# Patient Record
Sex: Female | Born: 1935
Health system: Southern US, Community
[De-identification: ages and names within clinical notes are randomized; demographics above are authoritative.]

## PROBLEM LIST (undated history)

## (undated) DIAGNOSIS — E785 Hyperlipidemia, unspecified: Secondary | ICD-10-CM

## (undated) DIAGNOSIS — K2100 Gastro-esophageal reflux disease with esophagitis, without bleeding: Secondary | ICD-10-CM

## (undated) DIAGNOSIS — E119 Type 2 diabetes mellitus without complications: Secondary | ICD-10-CM

## (undated) DIAGNOSIS — N6019 Diffuse cystic mastopathy of unspecified breast: Secondary | ICD-10-CM

## (undated) DIAGNOSIS — Z9181 History of falling: Secondary | ICD-10-CM

## (undated) DIAGNOSIS — S72009D Fracture of unspecified part of neck of unspecified femur, subsequent encounter for closed fracture with routine healing: Secondary | ICD-10-CM

## (undated) DIAGNOSIS — K559 Vascular disorder of intestine, unspecified: Secondary | ICD-10-CM

## (undated) DIAGNOSIS — Z5189 Encounter for other specified aftercare: Secondary | ICD-10-CM

## (undated) DIAGNOSIS — D682 Hereditary deficiency of other clotting factors: Secondary | ICD-10-CM

## (undated) DIAGNOSIS — I071 Rheumatic tricuspid insufficiency: Secondary | ICD-10-CM

## (undated) DIAGNOSIS — D126 Benign neoplasm of colon, unspecified: Secondary | ICD-10-CM

## (undated) DIAGNOSIS — E559 Vitamin D deficiency, unspecified: Secondary | ICD-10-CM

## (undated) DIAGNOSIS — R7309 Other abnormal glucose: Secondary | ICD-10-CM

## (undated) DIAGNOSIS — I272 Pulmonary hypertension, unspecified: Secondary | ICD-10-CM

## (undated) DIAGNOSIS — G4700 Insomnia, unspecified: Secondary | ICD-10-CM

## (undated) DIAGNOSIS — I701 Atherosclerosis of renal artery: Secondary | ICD-10-CM

## (undated) DIAGNOSIS — K5792 Diverticulitis of intestine, part unspecified, without perforation or abscess without bleeding: Secondary | ICD-10-CM

## (undated) DIAGNOSIS — F3289 Other specified depressive episodes: Secondary | ICD-10-CM

## (undated) DIAGNOSIS — R011 Cardiac murmur, unspecified: Secondary | ICD-10-CM

## (undated) DIAGNOSIS — K5900 Constipation, unspecified: Secondary | ICD-10-CM

## (undated) DIAGNOSIS — R0989 Other specified symptoms and signs involving the circulatory and respiratory systems: Secondary | ICD-10-CM

## (undated) DIAGNOSIS — F329 Major depressive disorder, single episode, unspecified: Secondary | ICD-10-CM

## (undated) DIAGNOSIS — J310 Chronic rhinitis: Secondary | ICD-10-CM

## (undated) DIAGNOSIS — I10 Essential (primary) hypertension: Secondary | ICD-10-CM

## (undated) DIAGNOSIS — H3530 Unspecified macular degeneration: Secondary | ICD-10-CM

## (undated) DIAGNOSIS — I773 Arterial fibromuscular dysplasia: Secondary | ICD-10-CM

## (undated) DIAGNOSIS — K269 Duodenal ulcer, unspecified as acute or chronic, without hemorrhage or perforation: Secondary | ICD-10-CM

## (undated) DIAGNOSIS — R52 Pain, unspecified: Secondary | ICD-10-CM

## (undated) DIAGNOSIS — I771 Stricture of artery: Secondary | ICD-10-CM

## (undated) DIAGNOSIS — K219 Gastro-esophageal reflux disease without esophagitis: Secondary | ICD-10-CM

## (undated) HISTORY — PX: AORTA - SUPERIOR MESENTERIC AND AORTA - RENAL ARTERY BYPASS GRAFT: SUR175

## (undated) HISTORY — PX: RENAL ARTERY BYPASS: SHX2318

## (undated) HISTORY — PX: TONSILLECTOMY: SUR1361

## (undated) HISTORY — DX: Type 2 diabetes mellitus without complications: E11.9

## (undated) HISTORY — PX: APPENDECTOMY: SHX54

## (undated) HISTORY — PX: HIP SURGERY: SHX245

## (undated) HISTORY — PX: CHOLECYSTECTOMY: SHX55

## (undated) HISTORY — PX: DILATION AND CURETTAGE OF UTERUS: SHX78

---

## 2014-01-30 ENCOUNTER — Emergency Department (HOSPITAL_COMMUNITY)
Admission: EM | Admit: 2014-01-30 | Discharge: 2014-01-30 | Disposition: A | Discharge status: Rehabilitation Facility | Payer: Medicare Other | Attending: Emergency Medicine | Admitting: Emergency Medicine

## 2014-01-30 ENCOUNTER — Emergency Department (HOSPITAL_COMMUNITY): Payer: Medicare Other

## 2014-01-30 ENCOUNTER — Encounter (HOSPITAL_COMMUNITY): Payer: Self-pay | Admitting: Emergency Medicine

## 2014-01-30 DIAGNOSIS — W19XXXA Unspecified fall, initial encounter: Secondary | ICD-10-CM | POA: Insufficient documentation

## 2014-01-30 DIAGNOSIS — Y939 Activity, unspecified: Secondary | ICD-10-CM | POA: Insufficient documentation

## 2014-01-30 DIAGNOSIS — G4700 Insomnia, unspecified: Secondary | ICD-10-CM | POA: Insufficient documentation

## 2014-01-30 DIAGNOSIS — D682 Hereditary deficiency of other clotting factors: Secondary | ICD-10-CM | POA: Insufficient documentation

## 2014-01-30 DIAGNOSIS — K219 Gastro-esophageal reflux disease without esophagitis: Secondary | ICD-10-CM | POA: Insufficient documentation

## 2014-01-30 DIAGNOSIS — R7309 Other abnormal glucose: Secondary | ICD-10-CM | POA: Insufficient documentation

## 2014-01-30 DIAGNOSIS — R011 Cardiac murmur, unspecified: Secondary | ICD-10-CM | POA: Insufficient documentation

## 2014-01-30 DIAGNOSIS — K5900 Constipation, unspecified: Secondary | ICD-10-CM | POA: Insufficient documentation

## 2014-01-30 DIAGNOSIS — S72009D Fracture of unspecified part of neck of unspecified femur, subsequent encounter for closed fracture with routine healing: Secondary | ICD-10-CM | POA: Insufficient documentation

## 2014-01-30 DIAGNOSIS — K5732 Diverticulitis of large intestine without perforation or abscess without bleeding: Secondary | ICD-10-CM | POA: Insufficient documentation

## 2014-01-30 DIAGNOSIS — I701 Atherosclerosis of renal artery: Secondary | ICD-10-CM | POA: Insufficient documentation

## 2014-01-30 DIAGNOSIS — R0989 Other specified symptoms and signs involving the circulatory and respiratory systems: Secondary | ICD-10-CM | POA: Insufficient documentation

## 2014-01-30 DIAGNOSIS — D126 Benign neoplasm of colon, unspecified: Secondary | ICD-10-CM | POA: Insufficient documentation

## 2014-01-30 DIAGNOSIS — K269 Duodenal ulcer, unspecified as acute or chronic, without hemorrhage or perforation: Secondary | ICD-10-CM | POA: Insufficient documentation

## 2014-01-30 DIAGNOSIS — Z5189 Encounter for other specified aftercare: Secondary | ICD-10-CM | POA: Insufficient documentation

## 2014-01-30 DIAGNOSIS — I10 Essential (primary) hypertension: Secondary | ICD-10-CM | POA: Insufficient documentation

## 2014-01-30 DIAGNOSIS — K559 Vascular disorder of intestine, unspecified: Secondary | ICD-10-CM | POA: Insufficient documentation

## 2014-01-30 DIAGNOSIS — K2100 Gastro-esophageal reflux disease with esophagitis, without bleeding: Secondary | ICD-10-CM | POA: Insufficient documentation

## 2014-01-30 DIAGNOSIS — Z9181 History of falling: Secondary | ICD-10-CM | POA: Insufficient documentation

## 2014-01-30 DIAGNOSIS — F329 Major depressive disorder, single episode, unspecified: Secondary | ICD-10-CM | POA: Insufficient documentation

## 2014-01-30 DIAGNOSIS — N6019 Diffuse cystic mastopathy of unspecified breast: Secondary | ICD-10-CM | POA: Insufficient documentation

## 2014-01-30 DIAGNOSIS — S7001XA Contusion of right hip, initial encounter: Secondary | ICD-10-CM

## 2014-01-30 DIAGNOSIS — F3289 Other specified depressive episodes: Secondary | ICD-10-CM | POA: Insufficient documentation

## 2014-01-30 DIAGNOSIS — E559 Vitamin D deficiency, unspecified: Secondary | ICD-10-CM | POA: Insufficient documentation

## 2014-01-30 DIAGNOSIS — Z87891 Personal history of nicotine dependence: Secondary | ICD-10-CM | POA: Insufficient documentation

## 2014-01-30 DIAGNOSIS — H3530 Unspecified macular degeneration: Secondary | ICD-10-CM | POA: Insufficient documentation

## 2014-01-30 DIAGNOSIS — I771 Stricture of artery: Secondary | ICD-10-CM | POA: Insufficient documentation

## 2014-01-30 DIAGNOSIS — R52 Pain, unspecified: Secondary | ICD-10-CM | POA: Insufficient documentation

## 2014-01-30 DIAGNOSIS — I7789 Other specified disorders of arteries and arterioles: Secondary | ICD-10-CM | POA: Insufficient documentation

## 2014-01-30 DIAGNOSIS — J310 Chronic rhinitis: Secondary | ICD-10-CM | POA: Insufficient documentation

## 2014-01-30 DIAGNOSIS — Z888 Allergy status to other drugs, medicaments and biological substances status: Secondary | ICD-10-CM | POA: Insufficient documentation

## 2014-01-30 DIAGNOSIS — Z880 Allergy status to penicillin: Secondary | ICD-10-CM | POA: Insufficient documentation

## 2014-01-30 DIAGNOSIS — S7000XA Contusion of unspecified hip, initial encounter: Secondary | ICD-10-CM | POA: Insufficient documentation

## 2014-01-30 DIAGNOSIS — Y929 Unspecified place or not applicable: Secondary | ICD-10-CM | POA: Insufficient documentation

## 2014-01-30 DIAGNOSIS — I079 Rheumatic tricuspid valve disease, unspecified: Secondary | ICD-10-CM | POA: Insufficient documentation

## 2014-01-30 HISTORY — DX: Constipation, unspecified: K59.00

## 2014-01-30 HISTORY — DX: Gastro-esophageal reflux disease with esophagitis, without bleeding: K21.00

## 2014-01-30 HISTORY — DX: Diffuse cystic mastopathy of unspecified breast: N60.19

## 2014-01-30 HISTORY — DX: Essential (primary) hypertension: I10

## 2014-01-30 HISTORY — DX: Cardiac murmur, unspecified: R01.1

## 2014-01-30 HISTORY — DX: Encounter for other specified aftercare: Z51.89

## 2014-01-30 HISTORY — DX: History of falling: Z91.81

## 2014-01-30 HISTORY — DX: Gastro-esophageal reflux disease with esophagitis: K21.0

## 2014-01-30 HISTORY — DX: Other abnormal glucose: R73.09

## 2014-01-30 HISTORY — DX: Benign neoplasm of colon, unspecified: D12.6

## 2014-01-30 HISTORY — DX: Pain, unspecified: R52

## 2014-01-30 HISTORY — DX: Duodenal ulcer, unspecified as acute or chronic, without hemorrhage or perforation: K26.9

## 2014-01-30 HISTORY — DX: Pulmonary hypertension, unspecified: I27.20

## 2014-01-30 HISTORY — DX: Major depressive disorder, single episode, unspecified: F32.9

## 2014-01-30 HISTORY — DX: Other specified depressive episodes: F32.89

## 2014-01-30 HISTORY — DX: Stricture of artery: I77.1

## 2014-01-30 HISTORY — DX: Hereditary deficiency of other clotting factors: D68.2

## 2014-01-30 HISTORY — DX: Atherosclerosis of renal artery: I70.1

## 2014-01-30 HISTORY — DX: Vascular disorder of intestine, unspecified: K55.9

## 2014-01-30 HISTORY — DX: Diverticulitis of intestine, part unspecified, without perforation or abscess without bleeding: K57.92

## 2014-01-30 HISTORY — DX: Rheumatic tricuspid insufficiency: I07.1

## 2014-01-30 HISTORY — DX: Unspecified macular degeneration: H35.30

## 2014-01-30 HISTORY — DX: Arterial fibromuscular dysplasia: I77.3

## 2014-01-30 HISTORY — DX: Gastro-esophageal reflux disease without esophagitis: K21.9

## 2014-01-30 HISTORY — DX: Hyperlipidemia, unspecified: E78.5

## 2014-01-30 HISTORY — DX: Fracture of unspecified part of neck of unspecified femur, subsequent encounter for closed fracture with routine healing: S72.009D

## 2014-01-30 HISTORY — DX: Vitamin D deficiency, unspecified: E55.9

## 2014-01-30 HISTORY — DX: Chronic rhinitis: J31.0

## 2014-01-30 HISTORY — DX: Insomnia, unspecified: G47.00

## 2014-01-30 HISTORY — DX: Other specified symptoms and signs involving the circulatory and respiratory systems: R09.89

## 2014-01-30 NOTE — ED Notes (Signed)
Report attempted to [facility] multiple times.

## 2014-01-30 NOTE — ED Provider Notes (Signed)
CSN: [id number]     Arrival date & time 01/30/14  0356 History   First MD Initiated Contact with Patient 01/30/14 0421     Chief Complaint  Patient presents with  . Fall     (Consider location/radiation/quality/duration/timing/severity/associated sxs/prior Treatment) Patient is a 79 y.o. female presenting with fall. The history is provided by the patient.  Fall This is a new problem. Pertinent negatives include no chest pain, no abdominal pain, no headaches and no shortness of breath.   patient with fall. Complaining of right hip pain. Recently had hip replacement. No other complaints at this time. Patient is not exactly sure what happened. States she hurts her head. She she had done rehabilitation earlier today also. Does not think she hit her head  Past Medical History  Diagnosis Date  . Other specified rehabilitation procedure(V57.89)   . Aftercare for healing traumatic fracture of hip   . Personal history of fall   . Unspecified essential hypertension   . Other abnormal glucose   . Generalized pain   . Unspecified constipation   . Chronic rhinitis   . Depressive disorder, not elsewhere classified   . Reflux esophagitis   . Insomnia, unspecified   . Hyperlipidemia   . Fibromuscular dysplasia   . Diverticulitis   . GERD (gastroesophageal reflux disease)   . Factor V deficiency   . Heart murmur   . Carotid bruit   . Fibrocystic breast disease   . Hyperplasia, renal artery fibromuscular   . Macular degeneration   . Subclavian arterial stenosis   . Tubular adenoma of colon   . Mesenteric ischemia   . Renal artery stenosis   . Tricuspid regurgitation   . Pulmonary HTN   . Vitamin D deficiency   . Duodenal ulcer    Past Surgical History  Procedure Laterality Date  . Hip surgery    . Tonsillectomy    . Dilation and curettage of uterus    . Cholecystectomy    . Appendectomy    . Renal artery bypass    . Aorta - superior mesenteric and aorta - renal artery bypass  graft     History reviewed. No pertinent family history. History  Substance Use Topics  . Smoking status: Former Research scientist (life sciences)  . Smokeless tobacco: Former Systems developer  . Alcohol Use: No   OB History   Grav Para Term Preterm Abortions TAB SAB Ect Mult Living                 Review of Systems  Constitutional: Negative for activity change and appetite change.  Eyes: Negative for pain.  Respiratory: Negative for chest tightness and shortness of breath.   Cardiovascular: Negative for chest pain and leg swelling.  Gastrointestinal: Negative for nausea, vomiting, abdominal pain and diarrhea.  Genitourinary: Negative for flank pain.  Musculoskeletal: Negative for back pain, neck pain and neck stiffness.  Skin: Positive for wound. Negative for rash.  Neurological: Negative for weakness, numbness and headaches.  Hematological: Bruises/bleeds easily.  Psychiatric/Behavioral: Negative for behavioral problems.      Allergies  Effexor; Mirapex; Nexium; Prevacid; Ticlid; Wellbutrin; Amoxil; Metformin and related; and Penicillins  Home Medications   Prior to Admission medications   Not on File   BP 168/60  Pulse 70  Temp(Src) 97.9 F (36.6 C) (Oral)  Resp 18  Ht 5\' 4"  (1.626 m)  Wt 154 lb (69.854 kg)  BMI 26.42 kg/m2  SpO2 98% Physical Exam  Constitutional: She appears well-developed and well-nourished.  Cardiovascular:  Normal rate and regular rhythm.   Pulmonary/Chest: Effort normal and breath sounds normal.  Abdominal: Soft. There is no tenderness.  Musculoskeletal:  Dressing over right hip with surgical wound with staples. Mild tenderness. No fluctuance. Some pain with movement of right hip. No deformity. Neurovascular intact over right foot. No tenderness her abdomen.  Neurological: She is alert.  Patient is awake and appropriate. It reported baseline.  Skin: Skin is warm.    ED Course  Procedures (including critical care time) Labs Review Labs Reviewed - No data to  display  Imaging Review Dg Hip Complete Right  01/30/2014   CLINICAL DATA:  Fall with right hip pain.  EXAM: RIGHT HIP - COMPLETE 2+ VIEW  COMPARISON:  None.  FINDINGS: Right hip bipolar hemiarthroplasty. There is a cerclage wire along the proximal femur. There has been recent right hip surgery given there is cutaneous staples and periarticular gas. No dislocation or periprosthetic fracture.  No evidence of pelvic ring fracture or diastasis. Notable/advanced L4-5 degenerative disc disease with marked disc narrowing and endplate spurring.  IMPRESSION: Recent right hip hemiarthroplasty.  No adverse findings.   Electronically Signed   By: Jorje Guild M.D.   On: 01/30/2014 05:36     EKG Interpretation None      MDM   Final diagnoses:  Fall, initial encounter  Contusion, hip, right, initial encounter    Patient with fall. Right hip pain, no recent replacement. X-ray negative. No other apparent injury. Patient is at her baseline will be discharged back    [facility]. Alvino Chapel, MD 01/30/14 3244

## 2014-01-30 NOTE — Discharge Instructions (Signed)
Contusion °A contusion is a deep bruise. Contusions are the result of an injury that caused bleeding under the skin. The contusion may turn blue, purple, or yellow. Minor injuries will give you a painless contusion, but more severe contusions may stay painful and swollen for a few weeks.  °CAUSES  °A contusion is usually caused by a blow, trauma, or direct force to an area of the body. °SYMPTOMS  °· Swelling and redness of the injured area. °· Bruising of the injured area. °· Tenderness and soreness of the injured area. °· Pain. °DIAGNOSIS  °The diagnosis can be made by taking a history and physical exam. An X-ray, CT scan, or MRI may be needed to determine if there were any associated injuries, such as fractures. °TREATMENT  °Specific treatment will depend on what area of the body was injured. In general, the best treatment for a contusion is resting, icing, elevating, and applying cold compresses to the injured area. Over-the-counter medicines may also be recommended for pain control. Ask your caregiver what the best treatment is for your contusion. °HOME CARE INSTRUCTIONS  °· Put ice on the injured area. °¨ Put ice in a plastic bag. °¨ Place a towel between your skin and the bag. °¨ Leave the ice on for 15-20 minutes, 3-4 times a day, or as directed by your health care provider. °· Only take over-the-counter or prescription medicines for pain, discomfort, or fever as directed by your caregiver. Your caregiver may recommend avoiding anti-inflammatory medicines (aspirin, ibuprofen, and naproxen) for 48 hours because these medicines may increase bruising. °· Rest the injured area. °· If possible, elevate the injured area to reduce swelling. °SEEK IMMEDIATE MEDICAL CARE IF:  °· You have increased bruising or swelling. °· You have pain that is getting worse. °· Your swelling or pain is not relieved with medicines. °MAKE SURE YOU:  °· Understand these instructions. °· Will watch your condition. °· Will get help right  away if you are not doing well or get worse. °Document Released: 05/02/2005 Document Revised: 07/28/2013 Document Reviewed: 05/28/2011 °ExitCare® Patient Information ©2015 [employer]. This information is not intended to replace advice given to you by your health care provider. Make sure you discuss any questions you have with your health care provider. ° °

## 2014-12-21 ENCOUNTER — Other Ambulatory Visit: Payer: Self-pay | Admitting: Nurse Practitioner

## 2014-12-21 ENCOUNTER — Other Ambulatory Visit: Payer: Self-pay

## 2014-12-21 DIAGNOSIS — T148XXA Other injury of unspecified body region, initial encounter: Secondary | ICD-10-CM

## 2015-11-25 DIAGNOSIS — Z79899 Other long term (current) drug therapy: Secondary | ICD-10-CM | POA: Diagnosis not present

## 2015-11-25 DIAGNOSIS — I10 Essential (primary) hypertension: Secondary | ICD-10-CM | POA: Diagnosis not present

## 2015-11-25 DIAGNOSIS — G894 Chronic pain syndrome: Secondary | ICD-10-CM | POA: Diagnosis not present

## 2015-11-25 DIAGNOSIS — I739 Peripheral vascular disease, unspecified: Secondary | ICD-10-CM | POA: Diagnosis not present

## 2015-11-25 DIAGNOSIS — R413 Other amnesia: Secondary | ICD-10-CM | POA: Diagnosis not present

## 2015-11-25 DIAGNOSIS — E559 Vitamin D deficiency, unspecified: Secondary | ICD-10-CM | POA: Diagnosis not present

## 2015-11-25 DIAGNOSIS — Z1389 Encounter for screening for other disorder: Secondary | ICD-10-CM | POA: Diagnosis not present

## 2015-11-25 DIAGNOSIS — E119 Type 2 diabetes mellitus without complications: Secondary | ICD-10-CM | POA: Diagnosis not present

## 2015-11-25 DIAGNOSIS — Z0001 Encounter for general adult medical examination with abnormal findings: Secondary | ICD-10-CM | POA: Diagnosis not present

## 2015-11-25 DIAGNOSIS — Z0000 Encounter for general adult medical examination without abnormal findings: Secondary | ICD-10-CM | POA: Diagnosis not present

## 2015-11-25 DIAGNOSIS — F325 Major depressive disorder, single episode, in full remission: Secondary | ICD-10-CM | POA: Diagnosis not present

## 2016-04-11 ENCOUNTER — Other Ambulatory Visit: Payer: Self-pay | Admitting: Internal Medicine

## 2016-04-11 DIAGNOSIS — Z1231 Encounter for screening mammogram for malignant neoplasm of breast: Secondary | ICD-10-CM

## 2016-04-17 ENCOUNTER — Ambulatory Visit
Admission: RE | Admit: 2016-04-17 | Discharge: 2016-04-17 | Disposition: A | Discharge status: Home / Self Care | Payer: Medicare Other | Source: Ambulatory Visit | Attending: Internal Medicine | Admitting: Internal Medicine

## 2016-04-17 DIAGNOSIS — Z1231 Encounter for screening mammogram for malignant neoplasm of breast: Secondary | ICD-10-CM | POA: Diagnosis not present

## 2016-05-29 DIAGNOSIS — F332 Major depressive disorder, recurrent severe without psychotic features: Secondary | ICD-10-CM | POA: Diagnosis not present

## 2016-06-01 DIAGNOSIS — G4700 Insomnia, unspecified: Secondary | ICD-10-CM | POA: Diagnosis not present

## 2016-06-01 DIAGNOSIS — E559 Vitamin D deficiency, unspecified: Secondary | ICD-10-CM | POA: Diagnosis not present

## 2016-06-01 DIAGNOSIS — F325 Major depressive disorder, single episode, in full remission: Secondary | ICD-10-CM | POA: Diagnosis not present

## 2016-06-01 DIAGNOSIS — Z23 Encounter for immunization: Secondary | ICD-10-CM | POA: Diagnosis not present

## 2016-06-01 DIAGNOSIS — I739 Peripheral vascular disease, unspecified: Secondary | ICD-10-CM | POA: Diagnosis not present

## 2016-06-01 DIAGNOSIS — G894 Chronic pain syndrome: Secondary | ICD-10-CM | POA: Diagnosis not present

## 2016-06-01 DIAGNOSIS — F419 Anxiety disorder, unspecified: Secondary | ICD-10-CM | POA: Diagnosis not present

## 2016-06-01 DIAGNOSIS — R413 Other amnesia: Secondary | ICD-10-CM | POA: Diagnosis not present

## 2016-06-01 DIAGNOSIS — I10 Essential (primary) hypertension: Secondary | ICD-10-CM | POA: Diagnosis not present

## 2016-06-01 DIAGNOSIS — E119 Type 2 diabetes mellitus without complications: Secondary | ICD-10-CM | POA: Diagnosis not present

## 2016-08-29 DIAGNOSIS — R2689 Other abnormalities of gait and mobility: Secondary | ICD-10-CM | POA: Diagnosis not present

## 2016-08-29 DIAGNOSIS — R269 Unspecified abnormalities of gait and mobility: Secondary | ICD-10-CM | POA: Diagnosis not present

## 2016-08-29 DIAGNOSIS — K529 Noninfective gastroenteritis and colitis, unspecified: Secondary | ICD-10-CM | POA: Diagnosis not present

## 2016-09-12 DIAGNOSIS — R2681 Unsteadiness on feet: Secondary | ICD-10-CM | POA: Diagnosis not present

## 2016-09-12 DIAGNOSIS — R262 Difficulty in walking, not elsewhere classified: Secondary | ICD-10-CM | POA: Diagnosis not present

## 2016-09-14 DIAGNOSIS — R2681 Unsteadiness on feet: Secondary | ICD-10-CM | POA: Diagnosis not present

## 2016-09-14 DIAGNOSIS — R262 Difficulty in walking, not elsewhere classified: Secondary | ICD-10-CM | POA: Diagnosis not present

## 2016-09-17 DIAGNOSIS — R2681 Unsteadiness on feet: Secondary | ICD-10-CM | POA: Diagnosis not present

## 2016-09-17 DIAGNOSIS — R262 Difficulty in walking, not elsewhere classified: Secondary | ICD-10-CM | POA: Diagnosis not present

## 2016-09-19 DIAGNOSIS — R2681 Unsteadiness on feet: Secondary | ICD-10-CM | POA: Diagnosis not present

## 2016-09-19 DIAGNOSIS — R262 Difficulty in walking, not elsewhere classified: Secondary | ICD-10-CM | POA: Diagnosis not present

## 2016-09-20 DIAGNOSIS — R262 Difficulty in walking, not elsewhere classified: Secondary | ICD-10-CM | POA: Diagnosis not present

## 2016-09-20 DIAGNOSIS — R2681 Unsteadiness on feet: Secondary | ICD-10-CM | POA: Diagnosis not present

## 2016-09-25 DIAGNOSIS — R2681 Unsteadiness on feet: Secondary | ICD-10-CM | POA: Diagnosis not present

## 2016-09-25 DIAGNOSIS — R262 Difficulty in walking, not elsewhere classified: Secondary | ICD-10-CM | POA: Diagnosis not present

## 2016-09-26 DIAGNOSIS — F332 Major depressive disorder, recurrent severe without psychotic features: Secondary | ICD-10-CM | POA: Diagnosis not present

## 2016-09-27 DIAGNOSIS — R2681 Unsteadiness on feet: Secondary | ICD-10-CM | POA: Diagnosis not present

## 2016-09-27 DIAGNOSIS — R262 Difficulty in walking, not elsewhere classified: Secondary | ICD-10-CM | POA: Diagnosis not present

## 2016-09-28 DIAGNOSIS — R2681 Unsteadiness on feet: Secondary | ICD-10-CM | POA: Diagnosis not present

## 2016-09-28 DIAGNOSIS — R262 Difficulty in walking, not elsewhere classified: Secondary | ICD-10-CM | POA: Diagnosis not present

## 2016-10-02 DIAGNOSIS — R262 Difficulty in walking, not elsewhere classified: Secondary | ICD-10-CM | POA: Diagnosis not present

## 2016-10-02 DIAGNOSIS — R2681 Unsteadiness on feet: Secondary | ICD-10-CM | POA: Diagnosis not present

## 2016-10-05 DIAGNOSIS — R2681 Unsteadiness on feet: Secondary | ICD-10-CM | POA: Diagnosis not present

## 2016-10-05 DIAGNOSIS — R262 Difficulty in walking, not elsewhere classified: Secondary | ICD-10-CM | POA: Diagnosis not present

## 2016-10-08 DIAGNOSIS — R2681 Unsteadiness on feet: Secondary | ICD-10-CM | POA: Diagnosis not present

## 2016-10-08 DIAGNOSIS — R262 Difficulty in walking, not elsewhere classified: Secondary | ICD-10-CM | POA: Diagnosis not present

## 2016-10-11 DIAGNOSIS — R262 Difficulty in walking, not elsewhere classified: Secondary | ICD-10-CM | POA: Diagnosis not present

## 2016-10-11 DIAGNOSIS — R2681 Unsteadiness on feet: Secondary | ICD-10-CM | POA: Diagnosis not present

## 2016-10-15 DIAGNOSIS — R262 Difficulty in walking, not elsewhere classified: Secondary | ICD-10-CM | POA: Diagnosis not present

## 2016-10-15 DIAGNOSIS — R2681 Unsteadiness on feet: Secondary | ICD-10-CM | POA: Diagnosis not present

## 2016-10-16 DIAGNOSIS — R262 Difficulty in walking, not elsewhere classified: Secondary | ICD-10-CM | POA: Diagnosis not present

## 2016-10-16 DIAGNOSIS — R2681 Unsteadiness on feet: Secondary | ICD-10-CM | POA: Diagnosis not present

## 2017-03-21 DIAGNOSIS — E1140 Type 2 diabetes mellitus with diabetic neuropathy, unspecified: Secondary | ICD-10-CM | POA: Diagnosis not present

## 2017-03-21 DIAGNOSIS — Z1389 Encounter for screening for other disorder: Secondary | ICD-10-CM | POA: Diagnosis not present

## 2017-03-21 DIAGNOSIS — E785 Hyperlipidemia, unspecified: Secondary | ICD-10-CM | POA: Diagnosis not present

## 2017-03-21 DIAGNOSIS — E559 Vitamin D deficiency, unspecified: Secondary | ICD-10-CM | POA: Diagnosis not present

## 2017-03-21 DIAGNOSIS — I2729 Other secondary pulmonary hypertension: Secondary | ICD-10-CM | POA: Diagnosis not present

## 2017-03-21 DIAGNOSIS — Z0000 Encounter for general adult medical examination without abnormal findings: Secondary | ICD-10-CM | POA: Diagnosis not present

## 2017-03-25 DIAGNOSIS — F332 Major depressive disorder, recurrent severe without psychotic features: Secondary | ICD-10-CM | POA: Diagnosis not present

## 2017-09-09 DIAGNOSIS — K5790 Diverticulosis of intestine, part unspecified, without perforation or abscess without bleeding: Secondary | ICD-10-CM | POA: Diagnosis not present

## 2017-09-09 DIAGNOSIS — R10814 Left lower quadrant abdominal tenderness: Secondary | ICD-10-CM | POA: Diagnosis not present

## 2017-09-12 ENCOUNTER — Other Ambulatory Visit: Payer: Self-pay | Admitting: Internal Medicine

## 2017-09-12 DIAGNOSIS — K5792 Diverticulitis of intestine, part unspecified, without perforation or abscess without bleeding: Secondary | ICD-10-CM

## 2017-09-12 DIAGNOSIS — R109 Unspecified abdominal pain: Secondary | ICD-10-CM

## 2017-09-13 ENCOUNTER — Other Ambulatory Visit: Payer: Self-pay | Admitting: Internal Medicine

## 2017-09-13 ENCOUNTER — Ambulatory Visit
Admission: RE | Admit: 2017-09-13 | Discharge: 2017-09-13 | Disposition: A | Discharge status: Home / Self Care | Payer: Medicare Other | Source: Ambulatory Visit | Attending: Internal Medicine | Admitting: Internal Medicine

## 2017-09-13 DIAGNOSIS — R109 Unspecified abdominal pain: Secondary | ICD-10-CM

## 2017-09-13 DIAGNOSIS — K5730 Diverticulosis of large intestine without perforation or abscess without bleeding: Secondary | ICD-10-CM | POA: Diagnosis not present

## 2017-09-13 DIAGNOSIS — K5792 Diverticulitis of intestine, part unspecified, without perforation or abscess without bleeding: Secondary | ICD-10-CM

## 2017-09-13 MED ORDER — IOPAMIDOL (ISOVUE-300) INJECTION 61%
100.0000 mL | Freq: Once | INTRAVENOUS | Status: AC | PRN
  Administered 2017-09-13: 100 mL via INTRAVENOUS

## 2017-09-21 ENCOUNTER — Other Ambulatory Visit: Payer: Medicare Other

## 2017-10-18 DIAGNOSIS — F332 Major depressive disorder, recurrent severe without psychotic features: Secondary | ICD-10-CM | POA: Diagnosis not present

## 2017-10-29 DIAGNOSIS — R4182 Altered mental status, unspecified: Secondary | ICD-10-CM | POA: Diagnosis not present

## 2017-10-29 DIAGNOSIS — R2681 Unsteadiness on feet: Secondary | ICD-10-CM | POA: Diagnosis not present

## 2017-10-30 ENCOUNTER — Other Ambulatory Visit: Payer: Self-pay

## 2017-10-30 ENCOUNTER — Emergency Department (HOSPITAL_COMMUNITY): Payer: Medicare Other

## 2017-10-30 ENCOUNTER — Emergency Department (HOSPITAL_COMMUNITY)
Admission: EM | Admit: 2017-10-30 | Discharge: 2017-10-30 | Disposition: A | Discharge status: Home / Self Care | Payer: Medicare Other | Attending: Emergency Medicine | Admitting: Emergency Medicine

## 2017-10-30 ENCOUNTER — Encounter (HOSPITAL_COMMUNITY): Payer: Self-pay

## 2017-10-30 DIAGNOSIS — Z87891 Personal history of nicotine dependence: Secondary | ICD-10-CM | POA: Insufficient documentation

## 2017-10-30 DIAGNOSIS — R109 Unspecified abdominal pain: Secondary | ICD-10-CM | POA: Diagnosis not present

## 2017-10-30 DIAGNOSIS — Z7902 Long term (current) use of antithrombotics/antiplatelets: Secondary | ICD-10-CM | POA: Diagnosis not present

## 2017-10-30 DIAGNOSIS — I10 Essential (primary) hypertension: Secondary | ICD-10-CM | POA: Diagnosis not present

## 2017-10-30 DIAGNOSIS — K2970 Gastritis, unspecified, without bleeding: Secondary | ICD-10-CM | POA: Diagnosis not present

## 2017-10-30 DIAGNOSIS — Z79899 Other long term (current) drug therapy: Secondary | ICD-10-CM | POA: Insufficient documentation

## 2017-10-30 DIAGNOSIS — R1084 Generalized abdominal pain: Secondary | ICD-10-CM

## 2017-10-30 DIAGNOSIS — Z7984 Long term (current) use of oral hypoglycemic drugs: Secondary | ICD-10-CM | POA: Diagnosis not present

## 2017-10-30 DIAGNOSIS — R110 Nausea: Secondary | ICD-10-CM | POA: Diagnosis not present

## 2017-10-30 DIAGNOSIS — K7460 Unspecified cirrhosis of liver: Secondary | ICD-10-CM | POA: Diagnosis not present

## 2017-10-30 LAB — CBC
HEMATOCRIT: 30.1 % — AB (ref 36.0–46.0)
Hemoglobin: 9.7 g/dL — ABNORMAL LOW (ref 12.0–15.0)
MCH: 26.9 pg (ref 26.0–34.0)
MCHC: 32.2 g/dL (ref 30.0–36.0)
MCV: 83.6 fL (ref 78.0–100.0)
PLATELETS: 398 10*3/uL (ref 150–400)
RBC: 3.6 MIL/uL — ABNORMAL LOW (ref 3.87–5.11)
RDW: 15.6 % — ABNORMAL HIGH (ref 11.5–15.5)
WBC: 9.1 10*3/uL (ref 4.0–10.5)

## 2017-10-30 LAB — COMPREHENSIVE METABOLIC PANEL
ALT: 12 U/L — AB (ref 14–54)
AST: 12 U/L — ABNORMAL LOW (ref 15–41)
Albumin: 3.6 g/dL (ref 3.5–5.0)
Alkaline Phosphatase: 73 U/L (ref 38–126)
Anion gap: 11 (ref 5–15)
BUN: 12 mg/dL (ref 6–20)
CHLORIDE: 99 mmol/L — AB (ref 101–111)
CO2: 24 mmol/L (ref 22–32)
CREATININE: 0.7 mg/dL (ref 0.44–1.00)
Calcium: 8.8 mg/dL — ABNORMAL LOW (ref 8.9–10.3)
GFR calc Af Amer: >60 mL/min (ref >60)
Glucose, Bld: 99 mg/dL (ref 65–99)
Potassium: 4.1 mmol/L (ref 3.5–5.1)
Sodium: 134 mmol/L — ABNORMAL LOW (ref 135–145)
TOTAL PROTEIN: 6.7 g/dL (ref 6.5–8.1)
Total Bilirubin: 0.5 mg/dL (ref 0.3–1.2)

## 2017-10-30 LAB — LIPASE, BLOOD: LIPASE: 22 U/L (ref 11–51)

## 2017-10-30 MED ORDER — MORPHINE SULFATE (PF) 4 MG/ML IV SOLN
4.0000 mg | Freq: Once | INTRAVENOUS | Status: AC
  Administered 2017-10-30: 4 mg via INTRAVENOUS
  Filled 2017-10-30 (×2): qty 1

## 2017-10-30 MED ORDER — IOPAMIDOL (ISOVUE-370) INJECTION 76%
100.0000 mL | Freq: Once | INTRAVENOUS | Status: AC | PRN
  Administered 2017-10-30: 100 mL via INTRAVENOUS

## 2017-10-30 MED ORDER — IOPAMIDOL (ISOVUE-370) INJECTION 76%
INTRAVENOUS | Status: AC
  Administered 2017-10-30: 100 mL via INTRAVENOUS
  Filled 2017-10-30: qty 100

## 2017-10-30 MED ORDER — ONDANSETRON 8 MG PO TBDP: 8.0000 mg | ORAL_TABLET | Freq: Three times a day (TID) | ORAL | 0 refills | Status: DC | PRN

## 2017-10-30 MED ORDER — ONDANSETRON HCL 4 MG/2ML IJ SOLN
4.0000 mg | Freq: Once | INTRAMUSCULAR | Status: AC
  Administered 2017-10-30: 4 mg via INTRAVENOUS
  Filled 2017-10-30: qty 2

## 2017-10-30 MED ORDER — SODIUM CHLORIDE 0.9 % IV BOLUS
1000.0000 mL | Freq: Once | INTRAVENOUS | Status: AC
  Administered 2017-10-30: 1000 mL via INTRAVENOUS

## 2017-10-30 NOTE — ED Triage Notes (Signed)
EMS reports Left side abd pain, IV started and 50 mcg Fentanyl given with good results. Pt is on Plavix, History of renal artery by pass. PCP requested [facility]. Pt is A & O x 4.

## 2017-10-30 NOTE — ED Notes (Signed)
Bed: RESA Expected date:  Expected time:  Means of arrival:  Comments: 

## 2017-10-30 NOTE — ED Provider Notes (Signed)
[place] [facility] DEPT Provider Note   CSN: [id number] Arrival date & time: 10/30/17  6301     History   Chief Complaint Chief Complaint  Patient presents with  . Abdominal Pain    HPI Kari Colon is a 83 y.o. female.  HPI Patient is an 83 year old female with a history of renal artery bypass who presents the emergency department with 24 hours of nausea and left-sided abdominal pain.  Denies flank pain.  No urinary frequency or dysuria.  Over the last month she has had some urinary incontinence.  No fevers or chills.  No vomiting.  No diarrhea.  History of diverticulitis and states this feels similar   Past Medical History:  Diagnosis Date  . Aftercare for healing traumatic fracture of hip   . Carotid bruit   . Chronic rhinitis   . Depressive disorder, not elsewhere classified   . Diverticulitis   . Duodenal ulcer   . Factor V deficiency ([place])   . Fibrocystic breast disease   . Fibromuscular dysplasia ([place])   . Generalized pain   . GERD (gastroesophageal reflux disease)   . Heart murmur   . Hyperlipidemia   . Hyperplasia, renal artery fibromuscular ([place])   . Insomnia, unspecified   . Macular degeneration   . Mesenteric ischemia ([place])   . Other abnormal glucose   . Other specified rehabilitation procedure(V57.89)   . Personal history of fall   . Pulmonary HTN ([place])   . Reflux esophagitis   . Renal artery stenosis ([place])   . Subclavian arterial stenosis ([place])   . Tricuspid regurgitation   . Tubular adenoma of colon   . Unspecified constipation   . Unspecified essential hypertension   . Vitamin D deficiency     There are no active problems to display for this patient.   Past Surgical History:  Procedure Laterality Date  . AORTA - SUPERIOR MESENTERIC AND AORTA - RENAL ARTERY BYPASS GRAFT    . APPENDECTOMY    . CHOLECYSTECTOMY    . DILATION AND CURETTAGE OF UTERUS    . HIP SURGERY    . RENAL ARTERY BYPASS    . TONSILLECTOMY        OB History   None      Home Medications    Prior to Admission medications   Medication Sig Start Date End Date Taking? Authorizing Provider  acetaminophen (TYLENOL) 650 MG CR tablet Take 1,300 mg by mouth every 8 (eight) hours as needed for pain.   Yes [provider]  ALPRAZolam Duanne Moron) 0.5 MG tablet Take 0.5 mg by mouth 3 (three) times daily as needed for anxiety. 10/12/17  Yes [provider]  clopidogrel (PLAVIX) 75 MG tablet Take 75 mg by mouth daily. 10/19/17  Yes [provider]  isosorbide mononitrate (IMDUR) 30 MG 24 hr tablet Take 30 mg by mouth daily. 08/21/17  Yes [provider]  LORazepam (ATIVAN) 1 MG tablet Take 1 mg by mouth 2 (two) times daily as needed for anxiety.  10/14/17  Yes [provider]  losartan (COZAAR) 50 MG tablet Take 50 mg by mouth daily. 08/21/17  Yes [provider]  metFORMIN (GLUCOPHAGE) 500 MG tablet Take 1,000 mg by mouth 2 (two) times daily. 10/27/17  Yes [provider]  mirtazapine (REMERON) 30 MG tablet Take 30 mg by mouth at bedtime. 10/27/17  Yes [provider]  PARoxetine (PAXIL) 30 MG tablet Take 30 mg by mouth 2 (two) times daily. 10/14/17  Yes [provider]  traMADol (ULTRAM) 50 MG tablet Take 50-100 mg by mouth 2 (two) times daily as needed for pain. 10/21/17  Yes [provider]  zolpidem (AMBIEN) 10 MG tablet Take 10 mg by mouth at bedtime. 10/11/17  Yes [provider]  ondansetron (ZOFRAN ODT) 8 MG disintegrating tablet Take 1 tablet (8 mg total) by mouth every 8 (eight) hours as needed for nausea or vomiting. 10/30/17   Jola Schmidt, MD    Family History History reviewed. No pertinent family history.  Social History Social History   Tobacco Use  . Smoking status: Former Research scientist (life sciences)  . Smokeless tobacco: Former Network engineer Use Topics  . Alcohol use: No  . Drug use: No     Allergies   Effexor [venlafaxine]; Mirapex [pramipexole  dihydrochloride]; Nexium [esomeprazole magnesium]; Prevacid [lansoprazole]; Ticlid [ticlopidine]; Wellbutrin [bupropion]; Amoxil [amoxicillin]; Metformin and related; and Penicillins   Review of Systems Review of Systems  All other systems reviewed and are negative.    Physical Exam Updated Vital Signs BP (!) 192/70 (BP Location: Right Arm)   Pulse 71   Temp 98.1 F (36.7 C) (Oral)   Resp 16   Ht 5\' 4"  (1.626 m)   Wt 63.5 kg (140 lb)   SpO2 92%   BMI 24.03 kg/m   Physical Exam  Constitutional: She is oriented to person, place, and time. She appears well-developed and well-nourished. No distress.  HENT:  Head: Normocephalic and atraumatic.  Eyes: EOM are normal.  Neck: Normal range of motion.  Cardiovascular: Normal rate, regular rhythm and normal heart sounds.  Pulmonary/Chest: Effort normal and breath sounds normal.  Abdominal: Soft. She exhibits no distension. There is no tenderness.  Musculoskeletal: Normal range of motion.  Neurological: She is alert and oriented to person, place, and time.  Skin: Skin is warm and dry.  Psychiatric: She has a normal mood and affect. Judgment normal.  Nursing note and vitals reviewed.    ED Treatments / Results  Labs (all labs ordered are listed, but only abnormal results are displayed) Labs Reviewed  CBC - Abnormal; Notable for the following components:      Result Value   RBC 3.60 (*)    Hemoglobin 9.7 (*)    HCT 30.1 (*)    RDW 15.6 (*)    All other components within normal limits  COMPREHENSIVE METABOLIC PANEL - Abnormal; Notable for the following components:   Sodium 134 (*)    Chloride 99 (*)    Calcium 8.8 (*)    AST 12 (*)    ALT 12 (*)    All other components within normal limits  LIPASE, BLOOD    EKG None  Radiology Dg Chest 2 View  Result Date: 10/30/2017 CLINICAL DATA:  Left-sided abdominal pain. EXAM: CHEST - 2 VIEW COMPARISON:  Abdominopelvic CT 09/13/2017. FINDINGS: Chronic elevation of the left  hemidiaphragm. There is mild bibasilar atelectasis or scarring. No confluent airspace opacity, pleural effusion or pneumothorax. The heart size is normal. There is aortic atherosclerosis. T11 compression deformity appears unchanged from previous abdominal CT. No evidence of pneumoperitoneum. IMPRESSION: No acute abdominal findings. Linear bibasilar atelectasis or scarring. Electronically Signed   By: Richardean Sale M.D.   On: 10/30/2017 11:12   Ct Angio Abd/pel W And/or Wo Contrast  Result Date: 10/30/2017 CLINICAL DATA:  Left-sided abdominal pain EXAM: CTA ABDOMEN AND PELVIS wITHOUT AND WITH CONTRAST TECHNIQUE: Multidetector CT imaging of the abdomen and pelvis was performed using the standard protocol  during bolus administration of intravenous contrast. Multiplanar reconstructed images and MIPs were obtained and reviewed to evaluate the vascular anatomy. CONTRAST:  168mL ISOVUE-370 IOPAMIDOL (ISOVUE-370) INJECTION 76% COMPARISON:  None. FINDINGS: VASCULAR Aorta: Diffuse atherosclerotic calcifications. Patent. No aneurysm. 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 Branches reconstitute via pancreatic duodenal branches from the SMA. SMA: Markedly aneurysmal. 1.3 cm in caliber. Bypass graft to the left renal artery is grossly patent. Branch vessels grossly patent. Renals: Right renal artery is patent. Origin of the native left renal artery is occluded. Bypass graft to the left renal artery is grossly patent. IMA: Severe disease at the origin.  Beyond the origin, it is patent. Inflow: Diffuse atherosclerotic calcifications at the right common iliac artery without significant narrowing. Right external and internal iliac arteries patent. Diffuse atherosclerotic calcifications in the left common iliac artery are noted. There is focal disease in the mid left common iliac artery which may be significant. Focal web-like soft and calcified plaque is noted. Left internal and external iliac arteries are patent. Proximal Outflow:  Grossly patent. Veins: No evidence of DVT.  Portal vein patent. Review of the MIP images confirms the above findings. NON-VASCULAR Lower chest: Emphysema. Hepatobiliary: Liver contour is nodular suggesting early cirrhosis. Postcholecystectomy. Pancreas: Unremarkable Spleen: Unremarkable Adrenals/Urinary Tract: Severe atrophy of the left kidney. Right kidney is within normal limits. Adrenal glands are within normal limits. Bladder is distended. Stomach/Bowel: Stomach is decompressed. Small hiatal hernia is suspected. There is diverticulosis in the colon. There is no obvious mass in the colon. There is no evidence of small-bowel obstruction. Sigmoid diverticulosis is prominent. Lymphatic: No abnormal retroperitoneal adenopathy. Small left para-aortic nodes are stable. Reproductive: Uterus is within normal limits. Other: No free fluid. Musculoskeletal: Stable T11 compression fracture. 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 Bypass graft from the SMA to the left renal artery is grossly patent. Significant narrowing at the origin of the IMA. There is a focal web-like area of narrowing in the left common iliac artery as described. NON-VASCULAR Cirrhotic liver. Electronically Signed   By: Marybelle Killings M.D.   On: 10/30/2017 15:15    Procedures Procedures (including critical care time)  Medications Ordered in ED Medications  morphine 4 MG/ML injection 4 mg (4 mg Intravenous Given 10/30/17 1421)  sodium chloride 0.9 % bolus 1,000 mL (0 mLs Intravenous Stopped 10/30/17 1420)  ondansetron (ZOFRAN) injection 4 mg (4 mg Intravenous Given 10/30/17 1139)  iopamidol (ISOVUE-370) 76 % injection 100 mL (100 mLs Intravenous Contrast Given 10/30/17 1231)     Initial Impression / Assessment and Plan / ED Course  I have reviewed the triage vital signs and the nursing notes.  Pertinent labs & imaging results that were available during my care of the  patient were reviewed by me and considered in my medical decision making (see chart for details).     No significant findings found on labs or CT imaging her abdomen.  She feels much better at this time.  Repeat abdominal exam is without peritoneal signs.  Overall well-appearing.  Stable for discharge.  Stable vital signs.  Patient and family understand return to the emergency department for new or worsening symptoms  Final Clinical Impressions(s) / ED Diagnoses   Final diagnoses:  Generalized abdominal pain    ED Discharge Orders        Ordered    ondansetron (ZOFRAN ODT) 8 MG disintegrating tablet  Every 8 hours PRN     10/30/17 1613  Jola Schmidt, MD 10/30/17 [phone]

## 2017-10-30 NOTE — ED Notes (Signed)
Pt ambulated to the restroom with 2 person assist. Upon standing, she started to urinate. She states that this has been happening for the last few weeks. She still requested to go to the restroom to finish. Gown and bedding changed.

## 2017-10-30 NOTE — ED Notes (Signed)
Pt did not take her BP meds this morning. Daughter states that they will resume her medications when she gets home.

## 2017-11-01 DIAGNOSIS — R1032 Left lower quadrant pain: Secondary | ICD-10-CM | POA: Diagnosis not present

## 2017-11-01 DIAGNOSIS — I2729 Other secondary pulmonary hypertension: Secondary | ICD-10-CM | POA: Diagnosis not present

## 2017-11-01 DIAGNOSIS — E1140 Type 2 diabetes mellitus with diabetic neuropathy, unspecified: Secondary | ICD-10-CM | POA: Diagnosis not present

## 2017-11-01 DIAGNOSIS — E1151 Type 2 diabetes mellitus with diabetic peripheral angiopathy without gangrene: Secondary | ICD-10-CM | POA: Diagnosis not present

## 2017-11-01 DIAGNOSIS — I10 Essential (primary) hypertension: Secondary | ICD-10-CM | POA: Diagnosis not present

## 2017-11-01 DIAGNOSIS — G8929 Other chronic pain: Secondary | ICD-10-CM | POA: Diagnosis not present

## 2017-11-13 ENCOUNTER — Other Ambulatory Visit: Payer: Self-pay | Admitting: Internal Medicine

## 2017-11-13 DIAGNOSIS — K559 Vascular disorder of intestine, unspecified: Secondary | ICD-10-CM | POA: Diagnosis not present

## 2017-11-13 DIAGNOSIS — I701 Atherosclerosis of renal artery: Secondary | ICD-10-CM | POA: Diagnosis not present

## 2017-11-13 DIAGNOSIS — R1032 Left lower quadrant pain: Secondary | ICD-10-CM

## 2017-11-13 DIAGNOSIS — R52 Pain, unspecified: Secondary | ICD-10-CM

## 2017-11-20 ENCOUNTER — Ambulatory Visit
Admission: RE | Admit: 2017-11-20 | Discharge: 2017-11-20 | Disposition: A | Discharge status: Home / Self Care | Payer: Medicare Other | Source: Ambulatory Visit | Attending: Internal Medicine | Admitting: Internal Medicine

## 2017-11-20 DIAGNOSIS — R1030 Lower abdominal pain, unspecified: Secondary | ICD-10-CM | POA: Diagnosis not present

## 2017-11-20 DIAGNOSIS — R52 Pain, unspecified: Secondary | ICD-10-CM

## 2017-11-20 DIAGNOSIS — R1032 Left lower quadrant pain: Secondary | ICD-10-CM

## 2017-11-20 MED ORDER — GADOBENATE DIMEGLUMINE 529 MG/ML IV SOLN
12.0000 mL | Freq: Once | INTRAVENOUS | Status: AC | PRN
  Administered 2017-11-20: 12 mL via INTRAVENOUS

## 2017-11-26 ENCOUNTER — Other Ambulatory Visit: Payer: Self-pay

## 2017-11-26 ENCOUNTER — Ambulatory Visit: Payer: Medicare Other | Admitting: Vascular Surgery

## 2017-11-26 ENCOUNTER — Encounter: Payer: Self-pay | Admitting: Vascular Surgery

## 2017-11-26 VITALS — BP 128/66 | HR 92 | Resp 18 | Ht 64.0 in | Wt 134.4 lb

## 2017-11-26 DIAGNOSIS — K551 Chronic vascular disorders of intestine: Secondary | ICD-10-CM

## 2017-11-26 DIAGNOSIS — I6523 Occlusion and stenosis of bilateral carotid arteries: Secondary | ICD-10-CM

## 2017-11-26 NOTE — Progress Notes (Signed)
[facility]ist of [facility]  Patient name: Kari Colon MRN: [id number] DOB: [birth year] Sex: female  REASON FOR CONSULT: Evaluation for mesenteric vascular disease  HPI: Kari Colon is a 83 y.o. female, who is seen today for discussion of recent CT angiogram.  She is here today with her daughter.  She had new onset of right and left side and low flank pain.  This was aggravated by activity such as standing from sitting to standing position.  He reports that the left side has resolved but she is persisting to have some pain in her right side.  Her daughter reports that this is greatly limited her independence.  She does have a history of mesenteric revascularization 9 years ago and was concerned regarding this as well.  She does have a history of fibromuscular dysplasia and 9 years ago reports that she underwent her mesenteric artery endarterectomy and left renal artery bypass.  Apparently she had a very small renal artery at that time.  She reports difficulty with constipation and requires a stool softener and suppository.  Reports that she has been seen by GI which says that she has difficulty with emptying her colon.  She does have a history of acid reflux.  She specifically denies any postprandial abdominal pain.  Her daughter reports that she is lost 10 pounds over the last several months.  Past Medical History:  Diagnosis Date  . Aftercare for healing traumatic fracture of hip   . Carotid bruit   . Chronic rhinitis   . Depressive disorder, not elsewhere classified   . Diabetes mellitus without complication ([place])   . Diverticulitis   . Duodenal ulcer   . Factor V deficiency ([place])   . Fibrocystic breast disease   . Fibromuscular dysplasia ([place])   . Generalized pain   . GERD (gastroesophageal reflux disease)   . Heart murmur   . Hyperlipidemia   . 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 Insomnia, unspecified   . Macular  degeneration   . Mesenteric ischemia ([place])   . Other abnormal glucose   . Other specified rehabilitation procedure(V57.89)   . Personal history of fall   . Pulmonary HTN ([place])   . Reflux esophagitis   . Renal artery stenosis ([place])   . Subclavian arterial stenosis ([place])   . Tricuspid regurgitation   . Tubular adenoma of colon   . Unspecified constipation   . Unspecified essential hypertension   . Vitamin D deficiency     History reviewed. No pertinent family history.  SOCIAL HISTORY: Social History   Socioeconomic History  . Marital status: Unknown    Spouse name: Not on file  . Number of children: Not on file  . Years of education: Not on file  . Highest education level: Not on file  Occupational History  . Not on file  Social Needs  . Financial resource strain: Not on file  . Food insecurity:    Worry: Not on file    Inability: Not on file  . Transportation needs:    Medical: Not on file    Non-medical: Not on file  Tobacco Use  . Smoking status: Current Every Day Smoker    Packs/day: 1.00    Types: Cigarettes  . Smokeless tobacco: Former Network engineer and Sexual Activity  . Alcohol use: No  . Drug use: No  . Sexual activity: Not on file  Lifestyle  . Physical activity:    Days per week: Not on  file    Minutes per session: Not on file  . Stress: Not on file  Relationships  . Social connections:    Talks on phone: Not on file    Gets together: Not on file    Attends religious service: Not on file    Active member of club or organization: Not on file    Attends meetings of clubs or organizations: Not on file    Relationship status: Not on file  . Intimate partner violence:    Fear of current or ex partner: Not on file    Emotionally abused: Not on file    Physically abused: Not on file    Forced sexual activity: Not on file  Other Topics Concern  . Not on file  Social History Narrative  . Not on file    Allergies  Allergen Reactions  . Effexor  [Venlafaxine]   . Mirapex [Pramipexole Dihydrochloride]   . Nexium [Esomeprazole Magnesium]   . Prevacid [Lansoprazole]   . Ticlid [Ticlopidine]   . Wellbutrin [Bupropion]   . Amoxil [Amoxicillin] Rash  . Metformin And Related Diarrhea    Can tolerate.10/30/17  . Penicillins Rash    Current Outpatient Medications  Medication Sig Dispense Refill  . acetaminophen (TYLENOL) 650 MG CR tablet Take 1,300 mg by mouth every 8 (eight) hours as needed for pain.    Marland Kitchen ALPRAZolam (XANAX) 0.5 MG tablet Take 0.5 mg by mouth 3 (three) times daily as needed for anxiety.  1  . clopidogrel (PLAVIX) 75 MG tablet Take 75 mg by mouth daily.  1  . isosorbide mononitrate (IMDUR) 30 MG 24 hr tablet Take 30 mg by mouth daily.  1  . LORazepam (ATIVAN) 1 MG tablet Take 1 mg by mouth 2 (two) times daily as needed for anxiety.   0  . losartan (COZAAR) 50 MG tablet Take 50 mg by mouth daily.  2  . metFORMIN (GLUCOPHAGE) 500 MG tablet Take 1,000 mg by mouth 2 (two) times daily.  4  . mirtazapine (REMERON) 30 MG tablet Take 30 mg by mouth at bedtime.  0  . ondansetron (ZOFRAN ODT) 8 MG disintegrating tablet Take 1 tablet (8 mg total) by mouth every 8 (eight) hours as needed for nausea or vomiting. 10 tablet 0  . PARoxetine (PAXIL) 30 MG tablet Take 30 mg by mouth 2 (two) times daily.  2  . traMADol (ULTRAM) 50 MG tablet Take 50-100 mg by mouth 2 (two) times daily as needed for pain.  0  . zolpidem (AMBIEN) 10 MG tablet Take 10 mg by mouth at bedtime.  4   No current facility-administered medications for this visit.     REVIEW OF SYSTEMS:  [X]  denotes positive finding, [ ]  denotes negative finding Cardiac  Comments:  Chest pain or chest pressure:    Shortness of breath upon exertion: x   Short of breath when lying flat:    Irregular heart rhythm:        Vascular    Pain in calf, thigh, or hip brought on by ambulation:    Pain in feet at night that wakes you up from your sleep:     Blood clot in your veins:      Leg swelling:  x       Pulmonary    Oxygen at home:    Productive cough:     Wheezing:         Neurologic    Sudden weakness in arms or legs:  Sudden numbness in arms or legs:     Sudden onset of difficulty speaking or slurred speech:    Temporary loss of vision in one eye:     Problems with dizziness:  x       Gastrointestinal    Blood in stool:     Vomited blood:         Genitourinary    Burning when urinating:     Blood in urine:        Psychiatric    Major depression:  x       Hematologic    Bleeding problems:    Problems with blood clotting too easily:        Skin    Rashes or ulcers:        Constitutional    Fever or chills:      PHYSICAL EXAM: Vitals:   11/26/17 1233  BP: 128/66  Pulse: 92  Resp: 18  SpO2: 98%  Weight: 134 lb 6.4 oz (61 kg)  Height: 5\' 4"  (1.626 m)    GENERAL: The patient is a well-nourished female, in no acute distress. The vital signs are documented above. CARDIOVASCULAR: 2+ radial, 2+ femoral and 2+ dorsalis pedis pulses bilaterally.  He does have bilateral carotid bruits PULMONARY: There is good air exchange  ABDOMEN: Soft and non-tender.  I do not feel any masses. MUSCULOSKELETAL: There are no major deformities or cyanosis. NEUROLOGIC: No focal weakness or paresthesias are detected. SKIN: There are no ulcers or rashes noted. PSYCHIATRIC: The patient has a normal affect.  DATA:  I reviewed her CT films.  This does show what appears to be endarterectomy of her superior mesenteric artery with a patch.  This was interpreted as aneurysm but suspect this was related to patch angioplasty.  She does have a left renal artery bypass arising from the superior mesenteric artery that is patent.  Her left kidney is quite atretic.  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  Her intermesenteric artery is patent but does have some narrowing at the origin and she has some narrowing in her left common iliac artery as well  MEDICAL ISSUES: I  had a long discussion with the patient regarding these findings.  This all does appear to be consistent with what was described to the patient and her daughter at the time of her extensive revascularization at [facility] 9 years ago.  She does not have any symptoms compatible with mesenteric ischemia and has a widely patent.  Mesenteric artery and some narrowing but patency of her inferior mesenteric artery.  She is having no symptoms from her left common iliac artery stenosis.  I do not feel that her symptoms are related to mesenteric flow.  She has normal pulse exam distally.  She does report that she is unknown carotid bruits but has had no carotid duplex in many years.  We will obtain these as an outpatient and let her know of the findings.  She does have significant stenosis we will have her see [place] for further discussion.  Otherwise we will see [place] on an as-needed basis   Rosetta Posner, MD [facility] V[facility] and Vein Specialists of [facility] Tel [phone] Pager [phone]

## 2017-12-03 ENCOUNTER — Ambulatory Visit (HOSPITAL_COMMUNITY)
Admission: RE | Admit: 2017-12-03 | Discharge: 2017-12-03 | Disposition: A | Discharge status: Home / Self Care | Payer: Medicare Other | Source: Ambulatory Visit | Attending: Vascular Surgery | Admitting: Vascular Surgery

## 2017-12-03 ENCOUNTER — Other Ambulatory Visit: Payer: Self-pay | Admitting: Internal Medicine

## 2017-12-03 DIAGNOSIS — M5416 Radiculopathy, lumbar region: Secondary | ICD-10-CM

## 2017-12-03 DIAGNOSIS — M5414 Radiculopathy, thoracic region: Secondary | ICD-10-CM

## 2017-12-03 DIAGNOSIS — I771 Stricture of artery: Secondary | ICD-10-CM | POA: Insufficient documentation

## 2017-12-03 DIAGNOSIS — I6523 Occlusion and stenosis of bilateral carotid arteries: Secondary | ICD-10-CM | POA: Diagnosis not present

## 2017-12-16 ENCOUNTER — Other Ambulatory Visit: Payer: Medicare Other

## 2017-12-27 ENCOUNTER — Ambulatory Visit
Admission: RE | Admit: 2017-12-27 | Discharge: 2017-12-27 | Disposition: A | Discharge status: Home / Self Care | Payer: Medicare Other | Source: Ambulatory Visit | Attending: Internal Medicine | Admitting: Internal Medicine

## 2017-12-27 DIAGNOSIS — M48061 Spinal stenosis, lumbar region without neurogenic claudication: Secondary | ICD-10-CM | POA: Diagnosis not present

## 2017-12-27 DIAGNOSIS — M5414 Radiculopathy, thoracic region: Secondary | ICD-10-CM

## 2017-12-27 DIAGNOSIS — M47814 Spondylosis without myelopathy or radiculopathy, thoracic region: Secondary | ICD-10-CM | POA: Diagnosis not present

## 2017-12-27 DIAGNOSIS — M5416 Radiculopathy, lumbar region: Secondary | ICD-10-CM

## 2017-12-29 ENCOUNTER — Emergency Department (HOSPITAL_COMMUNITY): Payer: Medicare Other

## 2017-12-29 ENCOUNTER — Other Ambulatory Visit: Payer: Self-pay

## 2017-12-29 ENCOUNTER — Observation Stay (HOSPITAL_COMMUNITY)
Admission: EM | Admit: 2017-12-29 | Discharge: 2018-01-02 | Disposition: A | Discharge status: Home / Self Care | Payer: Medicare Other | Attending: Internal Medicine | Admitting: Internal Medicine

## 2017-12-29 ENCOUNTER — Observation Stay (HOSPITAL_COMMUNITY): Payer: Medicare Other

## 2017-12-29 DIAGNOSIS — E118 Type 2 diabetes mellitus with unspecified complications: Secondary | ICD-10-CM

## 2017-12-29 DIAGNOSIS — E119 Type 2 diabetes mellitus without complications: Secondary | ICD-10-CM | POA: Insufficient documentation

## 2017-12-29 DIAGNOSIS — D682 Hereditary deficiency of other clotting factors: Secondary | ICD-10-CM | POA: Diagnosis not present

## 2017-12-29 DIAGNOSIS — Y999 Unspecified external cause status: Secondary | ICD-10-CM | POA: Insufficient documentation

## 2017-12-29 DIAGNOSIS — K219 Gastro-esophageal reflux disease without esophagitis: Secondary | ICD-10-CM | POA: Diagnosis not present

## 2017-12-29 DIAGNOSIS — S20229A Contusion of unspecified back wall of thorax, initial encounter: Secondary | ICD-10-CM | POA: Diagnosis not present

## 2017-12-29 DIAGNOSIS — F418 Other specified anxiety disorders: Secondary | ICD-10-CM | POA: Diagnosis present

## 2017-12-29 DIAGNOSIS — R011 Cardiac murmur, unspecified: Secondary | ICD-10-CM | POA: Diagnosis present

## 2017-12-29 DIAGNOSIS — Y92001 Dining room of unspecified non-institutional (private) residence as the place of occurrence of the external cause: Secondary | ICD-10-CM | POA: Diagnosis not present

## 2017-12-29 DIAGNOSIS — R42 Dizziness and giddiness: Secondary | ICD-10-CM

## 2017-12-29 DIAGNOSIS — W19XXXA Unspecified fall, initial encounter: Secondary | ICD-10-CM | POA: Diagnosis not present

## 2017-12-29 DIAGNOSIS — Y939 Activity, unspecified: Secondary | ICD-10-CM | POA: Insufficient documentation

## 2017-12-29 DIAGNOSIS — I10 Essential (primary) hypertension: Secondary | ICD-10-CM | POA: Insufficient documentation

## 2017-12-29 DIAGNOSIS — R531 Weakness: Secondary | ICD-10-CM | POA: Diagnosis not present

## 2017-12-29 DIAGNOSIS — R0989 Other specified symptoms and signs involving the circulatory and respiratory systems: Secondary | ICD-10-CM | POA: Diagnosis present

## 2017-12-29 DIAGNOSIS — Z79899 Other long term (current) drug therapy: Secondary | ICD-10-CM | POA: Diagnosis not present

## 2017-12-29 DIAGNOSIS — R55 Syncope and collapse: Secondary | ICD-10-CM | POA: Diagnosis not present

## 2017-12-29 DIAGNOSIS — S300XXA Contusion of lower back and pelvis, initial encounter: Secondary | ICD-10-CM | POA: Diagnosis not present

## 2017-12-29 DIAGNOSIS — F17210 Nicotine dependence, cigarettes, uncomplicated: Secondary | ICD-10-CM | POA: Insufficient documentation

## 2017-12-29 DIAGNOSIS — Z7984 Long term (current) use of oral hypoglycemic drugs: Secondary | ICD-10-CM | POA: Insufficient documentation

## 2017-12-29 DIAGNOSIS — Z7982 Long term (current) use of aspirin: Secondary | ICD-10-CM | POA: Insufficient documentation

## 2017-12-29 LAB — URINALYSIS, ROUTINE W REFLEX MICROSCOPIC
Bilirubin Urine: NEGATIVE
Glucose, UA: NEGATIVE mg/dL
Ketones, ur: 5 mg/dL — AB
Nitrite: NEGATIVE
Protein, ur: NEGATIVE mg/dL
SPECIFIC GRAVITY, URINE: 1.012 (ref 1.005–1.030)
pH: 5 (ref 5.0–8.0)

## 2017-12-29 LAB — BASIC METABOLIC PANEL
ANION GAP: 11 (ref 5–15)
BUN: 13 mg/dL (ref 6–20)
CALCIUM: 9 mg/dL (ref 8.9–10.3)
CO2: 25 mmol/L (ref 22–32)
Chloride: 100 mmol/L — ABNORMAL LOW (ref 101–111)
Creatinine, Ser: 0.84 mg/dL (ref 0.44–1.00)
GFR calc Af Amer: >60 mL/min (ref >60)
GLUCOSE: 89 mg/dL (ref 65–99)
POTASSIUM: 3.8 mmol/L (ref 3.5–5.1)
SODIUM: 136 mmol/L (ref 135–145)

## 2017-12-29 LAB — HEMOGLOBIN A1C
Hgb A1c MFr Bld: 5.7 % — ABNORMAL HIGH (ref 4.8–5.6)
Mean Plasma Glucose: 116.89 mg/dL

## 2017-12-29 LAB — CBC WITH DIFFERENTIAL/PLATELET
ABS IMMATURE GRANULOCYTES: 0.1 10*3/uL (ref 0.0–0.1)
BASOS ABS: 0.1 10*3/uL (ref 0.0–0.1)
BASOS PCT: 1 %
EOS PCT: 1 %
Eosinophils Absolute: 0.1 10*3/uL (ref 0.0–0.7)
HCT: 31.6 % — ABNORMAL LOW (ref 36.0–46.0)
Hemoglobin: 9.5 g/dL — ABNORMAL LOW (ref 12.0–15.0)
Immature Granulocytes: 1 %
LYMPHS PCT: 12 %
Lymphs Abs: 1.5 10*3/uL (ref 0.7–4.0)
MCH: 23.6 pg — ABNORMAL LOW (ref 26.0–34.0)
MCHC: 30.1 g/dL (ref 30.0–36.0)
MCV: 78.6 fL (ref 78.0–100.0)
MONO ABS: 0.9 10*3/uL (ref 0.1–1.0)
Monocytes Relative: 7 %
NEUTROS ABS: 9.8 10*3/uL — AB (ref 1.7–7.7)
Neutrophils Relative %: 78 %
PLATELETS: 464 10*3/uL — AB (ref 150–400)
RBC: 4.02 MIL/uL (ref 3.87–5.11)
RDW: 15.5 % (ref 11.5–15.5)
WBC: 12.4 10*3/uL — ABNORMAL HIGH (ref 4.0–10.5)

## 2017-12-29 LAB — GLUCOSE, CAPILLARY: GLUCOSE-CAPILLARY: 89 mg/dL (ref 65–99)

## 2017-12-29 LAB — RETICULOCYTES
RBC.: 3.59 MIL/uL — AB (ref 3.87–5.11)
RETIC COUNT ABSOLUTE: 68.2 10*3/uL (ref 19.0–186.0)
Retic Ct Pct: 1.9 % (ref 0.4–3.1)

## 2017-12-29 LAB — I-STAT TROPONIN, ED: TROPONIN I, POC: 0 ng/mL (ref 0.00–0.08)

## 2017-12-29 LAB — IRON AND TIBC
IRON: 25 ug/dL — AB (ref 28–170)
Saturation Ratios: 6 % — ABNORMAL LOW (ref 10.4–31.8)
TIBC: 431 ug/dL (ref 250–450)
UIBC: 406 ug/dL

## 2017-12-29 LAB — FOLATE: FOLATE: 11 ng/mL (ref >5.9)

## 2017-12-29 LAB — CBG MONITORING, ED: Glucose-Capillary: 71 mg/dL (ref 65–99)

## 2017-12-29 LAB — VITAMIN B12: VITAMIN B 12: 163 pg/mL — AB (ref 180–914)

## 2017-12-29 LAB — FERRITIN: Ferritin: 10 ng/mL — ABNORMAL LOW (ref 11–307)

## 2017-12-29 MED ORDER — VITAMIN D 1000 UNITS PO TABS
2000.0000 [IU] | ORAL_TABLET | Freq: Every day | ORAL | Status: DC
  Administered 2017-12-30 – 2018-01-02 (×4): 2000 [IU] via ORAL
  Filled 2017-12-29 (×4): qty 2

## 2017-12-29 MED ORDER — ACETAMINOPHEN 325 MG PO TABS
650.0000 mg | ORAL_TABLET | Freq: Four times a day (QID) | ORAL | Status: DC | PRN
  Administered 2017-12-30 – 2017-12-31 (×4): 650 mg via ORAL
  Filled 2017-12-29 (×4): qty 2

## 2017-12-29 MED ORDER — ACETAMINOPHEN 650 MG RE SUPP: 650.0000 mg | Freq: Four times a day (QID) | RECTAL | Status: DC | PRN

## 2017-12-29 MED ORDER — INSULIN ASPART 100 UNIT/ML ~~LOC~~ SOLN
0.0000 [IU] | Freq: Three times a day (TID) | SUBCUTANEOUS | Status: DC
Start: 2017-12-30 — End: 2018-01-03
  Administered 2017-12-30 – 2018-01-01 (×2): 1 [IU] via SUBCUTANEOUS

## 2017-12-29 MED ORDER — PANTOPRAZOLE SODIUM 40 MG PO TBEC
40.0000 mg | DELAYED_RELEASE_TABLET | Freq: Every day | ORAL | Status: DC
  Administered 2017-12-30 – 2018-01-02 (×4): 40 mg via ORAL
  Filled 2017-12-29 (×4): qty 1

## 2017-12-29 MED ORDER — SODIUM CHLORIDE 0.9 % IV SOLN
INTRAVENOUS | Status: DC
  Administered 2017-12-30: 14:00:00 via INTRAVENOUS

## 2017-12-29 MED ORDER — POLYETHYLENE GLYCOL 3350 17 G PO PACK
17.0000 g | PACK | Freq: Every day | ORAL | Status: DC | PRN
  Administered 2017-12-31: 17 g via ORAL
  Filled 2017-12-29: qty 1

## 2017-12-29 MED ORDER — ASPIRIN EC 81 MG PO TBEC
81.0000 mg | DELAYED_RELEASE_TABLET | Freq: Every day | ORAL | Status: DC
  Administered 2017-12-30 – 2018-01-02 (×4): 81 mg via ORAL
  Filled 2017-12-29 (×4): qty 1

## 2017-12-29 MED ORDER — ENOXAPARIN SODIUM 40 MG/0.4ML ~~LOC~~ SOLN
40.0000 mg | SUBCUTANEOUS | Status: DC
  Administered 2017-12-29 – 2018-01-01 (×4): 40 mg via SUBCUTANEOUS
  Filled 2017-12-29 (×5): qty 0.4

## 2017-12-29 MED ORDER — PAROXETINE HCL 20 MG PO TABS
60.0000 mg | ORAL_TABLET | Freq: Every day | ORAL | Status: DC
  Administered 2017-12-30 – 2018-01-02 (×4): 60 mg via ORAL
  Filled 2017-12-29 (×4): qty 3
  Filled 2017-12-29: qty 2

## 2017-12-29 MED ORDER — DOCUSATE SODIUM 100 MG PO CAPS
100.0000 mg | ORAL_CAPSULE | Freq: Two times a day (BID) | ORAL | Status: DC
  Administered 2017-12-29 – 2018-01-02 (×8): 100 mg via ORAL
  Filled 2017-12-29 (×8): qty 1

## 2017-12-29 MED ORDER — TRAMADOL HCL 50 MG PO TABS
100.0000 mg | ORAL_TABLET | Freq: Three times a day (TID) | ORAL | Status: DC | PRN
  Administered 2017-12-29 – 2018-01-02 (×4): 100 mg via ORAL
  Filled 2017-12-29 (×4): qty 2

## 2017-12-29 MED ORDER — CLOPIDOGREL BISULFATE 75 MG PO TABS
75.0000 mg | ORAL_TABLET | Freq: Every day | ORAL | Status: DC
  Administered 2017-12-30 – 2018-01-02 (×4): 75 mg via ORAL
  Filled 2017-12-29 (×4): qty 1

## 2017-12-29 MED ORDER — ONDANSETRON HCL 4 MG PO TABS: 4.0000 mg | ORAL_TABLET | Freq: Four times a day (QID) | ORAL | Status: DC | PRN

## 2017-12-29 MED ORDER — LOSARTAN POTASSIUM 50 MG PO TABS
50.0000 mg | ORAL_TABLET | Freq: Every day | ORAL | Status: DC
  Administered 2017-12-30 – 2018-01-02 (×4): 50 mg via ORAL
  Filled 2017-12-29 (×5): qty 1

## 2017-12-29 MED ORDER — ONDANSETRON HCL 4 MG/2ML IJ SOLN: 4.0000 mg | Freq: Four times a day (QID) | INTRAMUSCULAR | Status: DC | PRN

## 2017-12-29 MED ORDER — ALPRAZOLAM 0.5 MG PO TABS
0.5000 mg | ORAL_TABLET | Freq: Three times a day (TID) | ORAL | Status: DC | PRN
  Administered 2017-12-29 – 2018-01-02 (×10): 0.5 mg via ORAL
  Filled 2017-12-29 (×10): qty 1

## 2017-12-29 NOTE — H&P (Addendum)
History and Physical    Kari Colon DPO:[id number] DOB: 01-Jul-1935 DOA: 12/29/2017   PCP: [facility]   Patient coming from:  Home    Chief Complaint: Syncope  HPI: Kari Colon is a 83 y.o. female with extensive medical history listed below, including GERD, hyperlipidemia, hypertension, history of factor V Leyden, with a remote history of mesenteric ischemia, macular degeneration, depression, insomnia, brought to the emergency department, after finding herself underneath her table.  The patient does not have any recall of falling, but somehow she landed on her lifeline, which was automatically pressed, for which EMS was able to assess her, and help her.  She denies any trauma to the head;  it appears that she had a syncopal episode.  The patient reports feeling very weak over the last few days, prior, and had one presyncopal episode a few weeks ago.  She denies any chest pain or palpitations.  She denies any nausea, vomiting, headaches or vision changes.  Of note, she reports intermittent episodes of what it sounds like vertigo, when moving her head "side-to-side ", but she does not report any vertigo while lying down and this interview.  No confusion is reported.  She denies any abdominal pain.  No dysphagia.  She denies any dysuria or gross hematuria.  She denies any lower extremity swelling or calf pain.  She denies any history of PE or DVT.  Patient is fairly independent, and denies any unilaterShe denies any dysphagia.  Tobacco, alcohol or recreational drug use.  ED Course:  BP (!) 161/50 (BP Location: Left Arm)   Pulse 81   Temp (!) 97.5 F (36.4 C) (Oral)   Resp 16   Ht 5\' 3"  (1.6 m)   Wt 59.9 kg (132 lb)   SpO2 96%   BMI 23.38 kg/m   EKG shows normal sinus rhythm, without any acute abnormalities.   Troponin is 0  chest x-ray without acute disease, or opacities found.   Recent bilateral carotid ultrasound on 12/03/2017, shows mild left ICA disease, and  moderate ICA disease in the right Recent MRI of the T-spine done for evaluation of radiculopathy, shows chronic T3 and T11 compression without acute fracture and a 2 mm diameter thoracic cord site rinks T6-T9 without cord compression or cord lesion. MRI of the L-spine lumbar scoliosis, with multilevel DJD, without any compression UA pending   Review of Systems:  As per HPI otherwise all other systems reviewed and are negative  Past Medical History:  Diagnosis Date  . Aftercare for healing traumatic fracture of hip   . Carotid bruit   . Chronic rhinitis   . Depressive disorder, not elsewhere classified   . Diabetes mellitus without complication ([place])   . Diverticulitis   . Duodenal ulcer   . Factor V deficiency ([place])   . Fibrocystic breast disease   . Fibromuscular dysplasia ([place])   . Generalized pain   . GERD (gastroesophageal reflux disease)   . Heart murmur   . Hyperlipidemia   . 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 Insomnia, unspecified   . Macular degeneration   . Mesenteric ischemia ([place])   . Other abnormal glucose   . Other specified rehabilitation procedure(V57.89)   . Personal history of fall   . Pulmonary HTN ([place])   . Reflux esophagitis   . Renal artery stenosis ([place])   . Subclavian arterial stenosis ([place])   . Tricuspid regurgitation   . Tubular adenoma of colon   . Unspecified constipation   .  Unspecified essential hypertension   . Vitamin D deficiency     Past Surgical History:  Procedure Laterality Date  . AORTA - SUPERIOR MESENTERIC AND AORTA - RENAL ARTERY BYPASS GRAFT    . APPENDECTOMY    . CHOLECYSTECTOMY    . DILATION AND CURETTAGE OF UTERUS    . HIP SURGERY    . RENAL ARTERY BYPASS    . TONSILLECTOMY      Social History Social History   Socioeconomic History  . Marital status: Unknown    Spouse name: Not on file  . Number of children: Not on file  . Years of education: Not on file  . Highest education level: Not on file    Occupational History  . Not on file  Social Needs  . Financial resource strain: Not on file  . Food insecurity:    Worry: Not on file    Inability: Not on file  . Transportation needs:    Medical: Not on file    Non-medical: Not on file  Tobacco Use  . Smoking status: Current Every Day Smoker    Packs/day: 1.00    Types: Cigarettes  . Smokeless tobacco: Former Network engineer and Sexual Activity  . Alcohol use: No  . Drug use: No  . Sexual activity: Not on file  Lifestyle  . Physical activity:    Days per week: Not on file    Minutes per session: Not on file  . Stress: Not on file  Relationships  . Social connections:    Talks on phone: Not on file    Gets together: Not on file    Attends religious service: Not on file    Active member of club or organization: Not on file    Attends meetings of clubs or organizations: Not on file    Relationship status: Not on file  . Intimate partner violence:    Fear of current or ex partner: Not on file    Emotionally abused: Not on file    Physically abused: Not on file    Forced sexual activity: Not on file  Other Topics Concern  . Not on file  Social History Narrative  . Not on file     Allergies  Allergen Reactions  . Mirapex [Pramipexole Dihydrochloride] Other (See Comments)    insomnia  . Nexium [Esomeprazole Magnesium] Other (See Comments)    Ache in chest  . Prevacid [Lansoprazole] Other (See Comments)    Ache in chest  . Ticlid [Ticlopidine] Hives  . Wellbutrin [Bupropion] Hives and Other (See Comments)    lightheadedness  . Zantac [Ranitidine Hcl] Hives  . Amoxil [Amoxicillin] Rash  . Effexor [Venlafaxine] Rash  . Penicillins Rash    Has patient had a PCN reaction causing immediate rash, facial/tongue/throat swelling, SOB or lightheadedness with hypotension: Yes Has patient had a PCN reaction causing severe rash involving mucus membranes or skin necrosis: No Has patient had a PCN reaction that required  hospitalization: No Has patient had a PCN reaction occurring within the last 10 years: No If all of the above answers are "NO", then may proceed with Cephalosporin use.    No family history on file.    Prior to Admission medications   Medication Sig Start Date End Date Taking? Authorizing Provider  acetaminophen (TYLENOL) 650 MG CR tablet Take 1,300 mg by mouth every 8 (eight) hours as needed for pain.    [provider]  ALPRAZolam Duanne Moron) 0.5 MG tablet Take 0.5 mg  by mouth 3 (three) times daily as needed for anxiety. 10/12/17   [provider]  aspirin 81 MG tablet Take 81 mg by mouth daily.    [provider]  Cholecalciferol (VITAMIN D) 2000 units tablet Take 2,000 Units by mouth daily.    [provider]  clopidogrel (PLAVIX) 75 MG tablet Take 75 mg by mouth daily. 10/19/17   [provider]  isosorbide mononitrate (IMDUR) 30 MG 24 hr tablet Take 30 mg by mouth daily. 08/21/17   [provider]  LORazepam (ATIVAN) 1 MG tablet Take 1 mg by mouth 2 (two) times daily as needed for anxiety.  10/14/17   [provider]  losartan (COZAAR) 50 MG tablet Take 50 mg by mouth as needed.  08/21/17   [provider]  metFORMIN (GLUCOPHAGE) 500 MG tablet Take 1,000 mg by mouth 2 (two) times daily. 10/27/17   [provider]  mirtazapine (REMERON) 30 MG tablet Take 30 mg by mouth at bedtime. 10/27/17   [provider]  ondansetron (ZOFRAN ODT) 8 MG disintegrating tablet Take 1 tablet (8 mg total) by mouth every 8 (eight) hours as needed for nausea or vomiting. Patient not taking: Reported on 11/26/2017 10/30/17   Jola Schmidt, MD  oxyCODONE-acetaminophen (PERCOCET/ROXICET) 5-325 MG tablet  11/13/17   [provider]  PARoxetine (PAXIL) 30 MG tablet Take 30 mg by mouth 2 (two) times daily. 10/14/17   [provider]  RABEprazole (ACIPHEX) 20 MG tablet Take 20 mg by mouth daily.    [provider]    traMADol (ULTRAM) 50 MG tablet Take 50-100 mg by mouth 2 (two) times daily as needed for pain. 10/21/17   [provider]  zolpidem (AMBIEN) 10 MG tablet Take 10 mg by mouth at bedtime. 10/11/17   [provider]    Physical Exam:  Vitals:   12/29/17 1115 12/29/17 1119  BP: (!) 161/50   Pulse: 81   Resp: 16   Temp: (!) 97.5 F (36.4 C)   TempSrc: Oral   SpO2: 96%   Weight:  59.9 kg (132 lb)  Height:  5\' 3"  (1.6 m)   Constitutional: NAD, calm, comfortable, looks younger than stated age  eyes: PERRL, lids and conjunctivae normal ENMT: Mucous membranes are moist, without exudate or lesions  Neck: normal, supple, no masses, no thyromegaly Respiratory: clear to auscultation bilaterally, no wheezing, no crackles. Normal respiratory effort  Cardiovascular: Regular rate and rhythm, soft 1 out of 6 murmur, rubs or gallops. No extremity edema. 2+ pedal pulses.  Very mild right carotid bruits.  Abdomen: Soft, non tender, No hepatosplenomegaly. Bowel sounds positive.  Musculoskeletal: no clubbing / cyanosis. Moves all extremities Skin: no jaundice, No lesions.  Neurologic: Sensation intact  Strength equal in all extremities Psychiatric:   Alert and oriented x 3. Normal mood.   Labs on Admission: I have personally reviewed following labs and imaging studies  CBC: Recent Labs  Lab 12/29/17 1328  WBC 12.4*  NEUTROABS 9.8*  HGB 9.5*  HCT 31.6*  MCV 78.6  PLT 464*    Basic Metabolic Panel: Recent Labs  Lab 12/29/17 1328  NA 136  K 3.8  CL 100*  CO2 25  GLUCOSE 89  BUN 13  CREATININE 0.84  CALCIUM 9.0    GFR: Estimated Creatinine Clearance: 43.5 mL/min (by C-G formula based on SCr of 0.84 mg/dL).  Liver Function Tests: No results for input(s): AST, ALT, ALKPHOS, BILITOT, PROT, ALBUMIN in the last 168 hours.  No results for input(s): LIPASE, AMYLASE in the last 168 hours. No results for input(s): AMMONIA in the last 168 hours.  Coagulation Profile: No  results for input(s): INR, PROTIME in the last 168 hours.  Cardiac Enzymes: No results for input(s): CKTOTAL, CKMB, CKMBINDEX, TROPONINI in the last 168 hours.  BNP (last 3 results) No results for input(s): PROBNP in the last 8760 hours.  HbA1C: No results for input(s): HGBA1C in the last 72 hours.  CBG: Recent Labs  Lab 12/29/17 1126  GLUCAP 71    Lipid Profile: No results for input(s): CHOL, HDL, LDLCALC, TRIG, CHOLHDL, LDLDIRECT in the last 72 hours.  Thyroid Function Tests: No results for input(s): TSH, T4TOTAL, FREET4, T3FREE, THYROIDAB in the last 72 hours.  Anemia Panel: No results for input(s): VITAMINB12, FOLATE, FERRITIN, TIBC, IRON, RETICCTPCT in the last 72 hours.  Urine analysis: No results found for: COLORURINE, APPEARANCEUR, LABSPEC, PHURINE, GLUCOSEU, HGBUR, BILIRUBINUR, KETONESUR, PROTEINUR, UROBILINOGEN, NITRITE, LEUKOCYTESUR  Sepsis Labs: @LABRCNTIP (procalcitonin:4,lacticidven:4) )No results found for this or any previous visit (from the past 240 hour(s)).   Radiological Exams on Admission: Dg Chest 2 View  Result Date: 12/29/2017 CLINICAL DATA:  Syncope EXAM: CHEST - 2 VIEW COMPARISON:  10/30/2017 FINDINGS: Left hemidiaphragm remains elevated with subsegmental atelectasis at the lung bases. Lungs are otherwise grossly clear. Mild cardiomegaly. Normal vascularity. No pneumothorax or pleural effusion. Lower thoracic compression deformity is stable. IMPRESSION: No active cardiopulmonary disease. Electronically Signed   By: Marybelle Killings M.D.   On: 12/29/2017 14:53   Ct Head Wo Contrast  Result Date: 12/29/2017 CLINICAL DATA:  Patient found by EMS under the dinning room table. Patient states she does not remember how she got there HX HT, DM EXAM: CT HEAD WITHOUT CONTRAST TECHNIQUE: Contiguous axial images were obtained from the base of the skull through the vertex without intravenous contrast. COMPARISON:  None. FINDINGS: Brain: No evidence of acute infarction,  hemorrhage, extra-axial collection, ventriculomegaly, or mass effect. Generalized cerebral atrophy. Periventricular white matter low attenuation likely secondary to microangiopathy. Vascular: Cerebrovascular atherosclerotic calcifications are noted. Skull: Negative for fracture or focal lesion. Sinuses/Orbits: Visualized portions of the orbits are unremarkable. Visualized portions of the paranasal sinuses and mastoid air cells are unremarkable. Other: None. IMPRESSION: 1. No acute intracranial pathology. Electronically Signed   By: Kathreen Devoid   On: 12/29/2017 17:23    EKG: Independently reviewed.   Normal sinus rhythm Normal ECG No significant change since last tracing    Assessment/Plan Principal Problem:   Syncope Active Problems:   Carotid bruit   Depression with anxiety   Factor V deficiency (HCC)   GERD (gastroesophageal reflux disease)   Heart murmur   Syncope, unclear etiology, likely can be vasovagal, postural, drug-induced, anemia, or benign positional vertigo, versus infection, versus combination of all of the above.  White count is 12, UA is pending.  EKG, and troponin unrevealing.  Neuro exam normal..  Since bilateral carotid ultrasound shows moderate right ICA stenosis, with mild L ICA stenosis. Syncope order set  Observation Tele bed. Check orthostatics Fall precautions 2 D echo IV fluids Hold BP meds Neuro checks PT/OT in a.m. We will check a CT of the head, in view of the patient frequent episodes of falling, possibly recent history of presyncope. Hold Remeron and all other sedatives.   Leukocytosis, likely reactive, versus related to underlying infection.  UA is pending, white count is 12   IVF  Follow results of UA  repeat CBC in AM  Hypertension BP  161/50  Pulse 81    Hold home anti-hypertensive medications   Type II Diabetes Current blood sugar level is 89 No results found for: HGBA1C Hgb A1C Hold home oral diabetic medications.  SSI  History of  factor V Leyden Continue Plavix and aspirin  GERD, no acute symptoms Continue PPI  Anxiety and Depression Continue Xanax for anxiety and Paxil Hold Remeron  DVT prophylaxis:  Lovenox Code Status:    Full  Family Communication:  Discussed with patient Disposition Plan: Expect patient to be discharged to home after condition improves Consults called:    None Admission status: tele OBs    Sharene Butters, PA-C [facility]   Amion text  [phone] 12/29/2017, 5:57 PM    Patient seen and examined and agree with assessment/ plan as per S. Jorene Minors MD [facility] pgr [phone] 12/29/2017, 9:25 AM

## 2017-12-29 NOTE — ED Provider Notes (Signed)
[facility] EMERGENCY DEPARTMENT Provider Note   CSN: [id number] Arrival date & time: 12/29/17  1107     History   Chief Complaint Chief Complaint  Patient presents with  . Loss of Consciousness    HPI Kari Colon is a 83 y.o. female.atient found herself underneath her table prostate 10 AM today. She has no recall of falling. She thinks she suffered syncopal event. She struck her upper back as result fall. She denies shortness of breath denies chest pain denies headache No other associated symptoms. She pressed her lifeline. Her daughter was alerted and EMS was called. No treatment prior to coming here.  HPI  Past Medical History:  Diagnosis Date  . Aftercare for healing traumatic fracture of hip   . Carotid bruit   . Chronic rhinitis   . Depressive disorder, not elsewhere classified   . Diabetes mellitus without complication ([place])   . Diverticulitis   . Duodenal ulcer   . Factor V deficiency ([place])   . Fibrocystic breast disease   . Fibromuscular dysplasia ([place])   . Generalized pain   . GERD (gastroesophageal reflux disease)   . Heart murmur   . Hyperlipidemia   . 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 Insomnia, unspecified   . Macular degeneration   . Mesenteric ischemia ([place])   . Other abnormal glucose   . Other specified rehabilitation procedure(V57.89)   . Personal history of fall   . Pulmonary HTN ([place])   . Reflux esophagitis   . Renal artery stenosis ([place])   . Subclavian arterial stenosis ([place])   . Tricuspid regurgitation   . Tubular adenoma of colon   . Unspecified constipation   . Unspecified essential hypertension   . Vitamin D deficiency     There are no active problems to display for this patient.   Past Surgical History:  Procedure Laterality Date  . AORTA - SUPERIOR MESENTERIC AND AORTA - RENAL ARTERY BYPASS GRAFT    . APPENDECTOMY    . CHOLECYSTECTOMY    . DILATION AND CURETTAGE OF UTERUS    . HIP SURGERY     . RENAL ARTERY BYPASS    . TONSILLECTOMY       OB History   None      Home Medications    Prior to Admission medications   Medication Sig Start Date End Date Taking? Authorizing Provider  acetaminophen (TYLENOL) 650 MG CR tablet Take 1,300 mg by mouth every 8 (eight) hours as needed for pain.    [provider]  ALPRAZolam Duanne Moron) 0.5 MG tablet Take 0.5 mg by mouth 3 (three) times daily as needed for anxiety. 10/12/17   [provider]  aspirin 81 MG tablet Take 81 mg by mouth daily.    [provider]  Cholecalciferol (VITAMIN D) 2000 units tablet Take 2,000 Units by mouth daily.    [provider]  clopidogrel (PLAVIX) 75 MG tablet Take 75 mg by mouth daily. 10/19/17   [provider]  isosorbide mononitrate (IMDUR) 30 MG 24 hr tablet Take 30 mg by mouth daily. 08/21/17   [provider]  LORazepam (ATIVAN) 1 MG tablet Take 1 mg by mouth 2 (two) times daily as needed for anxiety.  10/14/17   [provider]  losartan (COZAAR) 50 MG tablet Take 50 mg by mouth as needed.  08/21/17   [provider]  metFORMIN (GLUCOPHAGE) 500 MG tablet Take 1,000 mg by mouth 2 (two) times daily. 10/27/17  [provider]  mirtazapine (REMERON) 30 MG tablet Take 30 mg by mouth at bedtime. 10/27/17   [provider]  ondansetron (ZOFRAN ODT) 8 MG disintegrating tablet Take 1 tablet (8 mg total) by mouth every 8 (eight) hours as needed for nausea or vomiting. Patient not taking: Reported on 11/26/2017 10/30/17   Jola Schmidt, MD  oxyCODONE-acetaminophen (PERCOCET/ROXICET) 5-325 MG tablet  11/13/17   [provider]  PARoxetine (PAXIL) 30 MG tablet Take 30 mg by mouth 2 (two) times daily. 10/14/17   [provider]  RABEprazole (ACIPHEX) 20 MG tablet Take 20 mg by mouth daily.    [provider]  traMADol (ULTRAM) 50 MG tablet Take 50-100 mg by mouth 2 (two) times daily as needed for pain. 10/21/17    [provider]  zolpidem (AMBIEN) 10 MG tablet Take 10 mg by mouth at bedtime. 10/11/17   [provider]    Family History No family history on file.  Social History Social History   Tobacco Use  . Smoking status: Current Every Day Smoker    Packs/day: 1.00    Types: Cigarettes  . Smokeless tobacco: Former Network engineer Use Topics  . Alcohol use: No  . Drug use: No     Allergies   Effexor [venlafaxine]; Mirapex [pramipexole dihydrochloride]; Nexium [esomeprazole magnesium]; Prevacid [lansoprazole]; Ticlid [ticlopidine]; Wellbutrin [bupropion]; Amoxil [amoxicillin]; Metformin and related; and Penicillins   Review of Systems Review of Systems  Musculoskeletal: Positive for gait problem.       Walks with walker  Hematological: Bruises/bleeds easily.  All other systems reviewed and are negative.    Physical Exam Updated Vital Signs BP (!) 161/50 (BP Location: Left Arm)   Pulse 81   Temp (!) 97.5 F (36.4 C) (Oral)   Resp 16   Ht 5\' 3"  (1.6 m)   Wt 59.9 kg (132 lb)   SpO2 96%   BMI 23.38 kg/m   Physical Exam  Constitutional: She appears well-developed and well-nourished. No distress.  HENT:  Head: Normocephalic and atraumatic.  Eyes: Pupils are equal, round, and reactive to light. Conjunctivae are normal.  Neck: Neck supple. No tracheal deviation present. No thyromegaly present.  Cardiovascular: Normal rate and regular rhythm.  No murmur heard. Pulmonary/Chest: Effort normal and breath sounds normal.  Abdominal: Soft. Bowel sounds are normal. She exhibits no distension. There is no tenderness.  Musculoskeletal: Normal range of motion. She exhibits no edema or tenderness.  Neurological: She is alert. Coordination normal.  Skin: Skin is warm and dry. No rash noted.  Reddish baseball-sized ecchymosis at right posterior chest otherwise without contusion or abrasion or ecchymosis  Psychiatric: She has a normal mood and affect.  Nursing note and  vitals reviewed.    ED Treatments / Results  Labs (all labs ordered are listed, but only abnormal results are displayed) Labs Reviewed  BASIC METABOLIC PANEL  CBC WITH DIFFERENTIAL/PLATELET  CBG MONITORING, ED  I-STAT TROPONIN, ED    EKG EKG Interpretation  Date/Time:  Sunday Dec 29 2017 11:26:56 EDT Ventricular Rate:  77 PR Interval:  170 QRS Duration: 80 QT Interval:  384 QTC Calculation: 434 R Axis:   70 Text Interpretation:  Normal sinus rhythm Normal ECG No significant change since last tracing Confirmed by Orlie Dakin [phone]) on 12/29/2017 12:33:07 PM   Radiology No results found. Chest x-ray viewed by me Results for orders placed or performed during the hospital encounter of 38/75/64  Basic metabolic panel  Result Value Ref Range  Sodium 136 135 - 145 mmol/L   Potassium 3.8 3.5 - 5.1 mmol/L   Chloride 100 (L) 101 - 111 mmol/L   CO2 25 22 - 32 mmol/L   Glucose, Bld 89 65 - 99 mg/dL   BUN 13 6 - 20 mg/dL   Creatinine, Ser 0.84 0.44 - 1.00 mg/dL   Calcium 9.0 8.9 - 10.3 mg/dL   GFR calc non Af Amer >60 >60 mL/min   GFR calc Af Amer >60 >60 mL/min   Anion gap 11 5 - 15  CBC with Differential/Platelet  Result Value Ref Range   WBC 12.4 (H) 4.0 - 10.5 K/uL   RBC 4.02 3.87 - 5.11 MIL/uL   Hemoglobin 9.5 (L) 12.0 - 15.0 g/dL   HCT 31.6 (L) 36.0 - 46.0 %   MCV 78.6 78.0 - 100.0 fL   MCH 23.6 (L) 26.0 - 34.0 pg   MCHC 30.1 30.0 - 36.0 g/dL   RDW 15.5 11.5 - 15.5 %   Platelets 464 (H) 150 - 400 K/uL   Neutrophils Relative % 78 %   Neutro Abs 9.8 (H) 1.7 - 7.7 K/uL   Lymphocytes Relative 12 %   Lymphs Abs 1.5 0.7 - 4.0 K/uL   Monocytes Relative 7 %   Monocytes Absolute 0.9 0.1 - 1.0 K/uL   Eosinophils Relative 1 %   Eosinophils Absolute 0.1 0.0 - 0.7 K/uL   Basophils Relative 1 %   Basophils Absolute 0.1 0.0 - 0.1 K/uL   Immature Granulocytes 1 %   Abs Immature Granulocytes 0.1 0.0 - 0.1 K/uL  CBG monitoring, ED  Result Value Ref Range    Glucose-Capillary 71 65 - 99 mg/dL   Comment 1 Notify RN    Comment 2 Document in Chart   I-stat troponin, ED  Result Value Ref Range   Troponin i, poc 0.00 0.00 - 0.08 ng/mL   Comment 3           Dg Chest 2 View  Result Date: 12/29/2017 CLINICAL DATA:  Syncope EXAM: CHEST - 2 VIEW COMPARISON:  10/30/2017 FINDINGS: Left hemidiaphragm remains elevated with subsegmental atelectasis at the lung bases. Lungs are otherwise grossly clear. Mild cardiomegaly. Normal vascularity. No pneumothorax or pleural effusion. Lower thoracic compression deformity is stable. IMPRESSION: No active cardiopulmonary disease. Electronically Signed   By: Marybelle Killings M.D.   On: 12/29/2017 14:53   Mr Thoracic Spine Wo Contrast  Result Date: 12/27/2017 CLINICAL DATA:  Thoracic radiculitis.  Lumbar radiculopathy. EXAM: MRI THORACIC AND LUMBAR SPINE WITHOUT CONTRAST TECHNIQUE: Multiplanar and multiecho pulse sequences of the thoracic and lumbar spine were obtained without intravenous contrast. COMPARISON:  None. FINDINGS: MRI THORACIC SPINE FINDINGS Alignment: Anterolisthesis T2 relative to T4 due to chronic fracture T3. Remaining alignment normal Vertebrae: Severe chronic compression fracture of T3 which has healed. Moderate compression fracture of T11 which has healed. Compression fracture of L1. See below report. Negative for acute fracture or mass Cord: Thoracic cord syrinx extending from T6 through T9 measuring up to 2 mm in diameter. No cord mass lesion identified. No cord compression. Paraspinal and other soft tissues: Paraspinous soft tissues normal. No pleural effusion. No soft tissue mass Disc levels: Disc degeneration and spurring at T3-4 related to chronic fracture of T3. No significant spinal stenosis. Disc degeneration and spurring superior endplate of X93 related to chronic fracture of T11. Mild cord flattening without significant spinal stenosis. Mild disc degeneration and facet degeneration at T11-T12 and T12-L1  without significant spinal stenosis. MRI  LUMBAR SPINE FINDINGS Segmentation:  Normal Alignment:  Lumbar scoliosis.  Normal alignment. Vertebrae: Chronic compression fracture of L1. Disc degeneration and endplate spurring on the left at L1-2. No acute lumbar fracture or mass. Conus medullaris and cauda equina: Conus extends to the L1-2 level. Conus and cauda equina appear normal. Paraspinal and other soft tissues: Markedly atrophic left kidney. Right kidney normal. No retroperitoneal mass. Disc levels: L1-2: Disc degeneration and spurring on the left with moderate left foraminal encroachment. Mild spinal stenosis L2-3: Disc degeneration with disc bulging and spurring. Mild facet degeneration. Mild subarticular stenosis on the left L3-4: Disc degeneration with disc bulging and spurring right greater than left. Mild facet degeneration bilaterally. Moderate right subarticular stenosis. Mild foraminal narrowing bilaterally L4-5: Disc degeneration and spondylosis right greater than left. Severe right subarticular and foraminal encroachment. Spinal canal adequate in size L5-S1: Disc degeneration and spurring on the right. Mild facet hypertrophy. Moderate right subarticular and foraminal encroachment due to spurring. IMPRESSION: MR THORACIC SPINE IMPRESSION Chronic compression fracture T3 and T11.  No acute fracture. 2 mm diameter thoracic cord syrinx T6 through T9 without cord compression or cord lesion. Mild thoracic degenerative changes without significant stenosis. MR LUMBAR SPINE IMPRESSION Lumbar scoliosis with multilevel degenerative change. Moderate subarticular stenosis on the right at L3-4 with mild foraminal narrowing bilaterally. Severe right subarticular and foraminal stenosis L4-5 due to spurring Moderate subarticular and foraminal stenosis L5-S1 due to spurring. Electronically Signed   By: Franchot Gallo M.D.   On: 12/27/2017 13:13   Mr Lumbar Spine Wo Contrast  Result Date: 12/27/2017 CLINICAL DATA:   Thoracic radiculitis.  Lumbar radiculopathy. EXAM: MRI THORACIC AND LUMBAR SPINE WITHOUT CONTRAST TECHNIQUE: Multiplanar and multiecho pulse sequences of the thoracic and lumbar spine were obtained without intravenous contrast. COMPARISON:  None. FINDINGS: MRI THORACIC SPINE FINDINGS Alignment: Anterolisthesis T2 relative to T4 due to chronic fracture T3. Remaining alignment normal Vertebrae: Severe chronic compression fracture of T3 which has healed. Moderate compression fracture of T11 which has healed. Compression fracture of L1. See below report. Negative for acute fracture or mass Cord: Thoracic cord syrinx extending from T6 through T9 measuring up to 2 mm in diameter. No cord mass lesion identified. No cord compression. Paraspinal and other soft tissues: Paraspinous soft tissues normal. No pleural effusion. No soft tissue mass Disc levels: Disc degeneration and spurring at T3-4 related to chronic fracture of T3. No significant spinal stenosis. Disc degeneration and spurring superior endplate of W43 related to chronic fracture of T11. Mild cord flattening without significant spinal stenosis. Mild disc degeneration and facet degeneration at T11-T12 and T12-L1 without significant spinal stenosis. MRI LUMBAR SPINE FINDINGS Segmentation:  Normal Alignment:  Lumbar scoliosis.  Normal alignment. Vertebrae: Chronic compression fracture of L1. Disc degeneration and endplate spurring on the left at L1-2. No acute lumbar fracture or mass. Conus medullaris and cauda equina: Conus extends to the L1-2 level. Conus and cauda equina appear normal. Paraspinal and other soft tissues: Markedly atrophic left kidney. Right kidney normal. No retroperitoneal mass. Disc levels: L1-2: Disc degeneration and spurring on the left with moderate left foraminal encroachment. Mild spinal stenosis L2-3: Disc degeneration with disc bulging and spurring. Mild facet degeneration. Mild subarticular stenosis on the left L3-4: Disc degeneration  with disc bulging and spurring right greater than left. Mild facet degeneration bilaterally. Moderate right subarticular stenosis. Mild foraminal narrowing bilaterally L4-5: Disc degeneration and spondylosis right greater than left. Severe right subarticular and foraminal encroachment. Spinal canal adequate in size L5-S1: Disc  degeneration and spurring on the right. Mild facet hypertrophy. Moderate right subarticular and foraminal encroachment due to spurring. IMPRESSION: MR THORACIC SPINE IMPRESSION Chronic compression fracture T3 and T11.  No acute fracture. 2 mm diameter thoracic cord syrinx T6 through T9 without cord compression or cord lesion. Mild thoracic degenerative changes without significant stenosis. MR LUMBAR SPINE IMPRESSION Lumbar scoliosis with multilevel degenerative change. Moderate subarticular stenosis on the right at L3-4 with mild foraminal narrowing bilaterally. Severe right subarticular and foraminal stenosis L4-5 due to spurring Moderate subarticular and foraminal stenosis L5-S1 due to spurring. Electronically Signed   By: Franchot Gallo M.D.   On: 12/27/2017 13:13   Procedures Procedures (including critical care time)  Medications Ordered in ED Medications - No data to display   Initial Impression / Assessment and Plan / ED Course  I have reviewed the triage vital signs and the nursing notes.  Pertinent labs & imaging results that were available during my care of the patient were reviewed by me and considered in my medical decision making (see chart for details).     4:05 PM patient comfortable. No distress..hospitalist service consulted who will arrange for overnight stay Final Clinical Impressions(s) / ED Diagnoses  dx #1 syncope #2 contusion of back Final diagnoses:  None    ED Discharge Orders    None       Orlie Dakin, MD 12/29/17 [id number]

## 2017-12-29 NOTE — ED Triage Notes (Signed)
Patient found by EMS under the dinning room table. Patient states she does not remember how she got there. Patient AAO x 3.

## 2017-12-30 ENCOUNTER — Observation Stay (HOSPITAL_BASED_OUTPATIENT_CLINIC_OR_DEPARTMENT_OTHER): Payer: Medicare Other

## 2017-12-30 DIAGNOSIS — E118 Type 2 diabetes mellitus with unspecified complications: Secondary | ICD-10-CM | POA: Diagnosis not present

## 2017-12-30 DIAGNOSIS — K219 Gastro-esophageal reflux disease without esophagitis: Secondary | ICD-10-CM

## 2017-12-30 DIAGNOSIS — R55 Syncope and collapse: Secondary | ICD-10-CM | POA: Diagnosis not present

## 2017-12-30 DIAGNOSIS — F418 Other specified anxiety disorders: Secondary | ICD-10-CM | POA: Diagnosis not present

## 2017-12-30 DIAGNOSIS — I10 Essential (primary) hypertension: Secondary | ICD-10-CM

## 2017-12-30 DIAGNOSIS — I361 Nonrheumatic tricuspid (valve) insufficiency: Secondary | ICD-10-CM

## 2017-12-30 DIAGNOSIS — R011 Cardiac murmur, unspecified: Secondary | ICD-10-CM | POA: Diagnosis not present

## 2017-12-30 DIAGNOSIS — D682 Hereditary deficiency of other clotting factors: Secondary | ICD-10-CM | POA: Diagnosis not present

## 2017-12-30 LAB — GLUCOSE, CAPILLARY
GLUCOSE-CAPILLARY: 102 mg/dL — AB (ref 65–99)
GLUCOSE-CAPILLARY: 141 mg/dL — AB (ref 65–99)
Glucose-Capillary: 101 mg/dL — ABNORMAL HIGH (ref 65–99)
Glucose-Capillary: 110 mg/dL — ABNORMAL HIGH (ref 65–99)

## 2017-12-30 LAB — ECHOCARDIOGRAM COMPLETE
HEIGHTINCHES: 63 in
WEIGHTICAEL: 2070.56 [oz_av]

## 2017-12-30 MED ORDER — CYANOCOBALAMIN 1000 MCG/ML IJ SOLN
1000.0000 ug | Freq: Once | INTRAMUSCULAR | Status: AC
  Administered 2017-12-30: 1000 ug via INTRAMUSCULAR
  Filled 2017-12-30: qty 1

## 2017-12-30 NOTE — Progress Notes (Signed)
Progress Note    Kari Colon  MGQ:[id number] DOB: 10-Mar-1935  DOA: 12/29/2017 PCP: Josetta Huddle, MD    Brief Narrative:    Medical records reviewed and are as summarized below:  Kari Colon is an 83 y.o. female with extensive medical history listed below, including GERD, hyperlipidemia, hypertension, history of factor V Leyden, with a remote history of mesenteric ischemia, macular degeneration, depression, insomnia, brought to the emergency department, after finding herself underneath her table.  The patient does not have any recall of falling, but somehow she landed on her lifeline, which was automatically pressed, for which EMS was able to assess her, and help her.  She denies any trauma to the head;  it appears that she had a syncopal episode.  The patient reports feeling very weak over the last few days, prior, and had one presyncopal episode a few weeks ago.  She denies any chest pain or palpitations.  She denies any nausea, vomiting, headaches or vision changes.  Of note, she reports intermittent episodes of what it sounds like vertigo, when moving her head "side-to-side "    Assessment/Plan:   Principal Problem:   Syncope Active Problems:   Carotid bruit   Depression with anxiety   Factor V deficiency (HCC)   GERD (gastroesophageal reflux disease)   Heart murmur  Syncope, unclear etiology, likely can be vasovagal, postural, drug-induced, anemia, or benign positional vertigo, versus infection, versus combination of all of the above.  -bilateral carotid ultrasound shows moderate right ICA stenosis, with mild L ICA stenosis. -tele unrevealing -orthostatics appear positive-- IVF and recheck in AM 2 D echo Hold BP meds Neuro checks PT/OT- 24 hour supervision/SNF. Hold Remeron and all other sedatives. -urine culture pending  B12 low -IM B12 replacement  Leukocytosis, likely reactive, versus related to underlying infection.  UA is pending, white count is 12  IVF  ?UTI-- having some retention  Hypertension  -resume home meds  Type II Diabetes  Hgb A1C: 5.7 Hold home oral diabetic medications.  SSI  History of factor V Leyden Continue Plavix and aspirin  GERD, no acute symptoms Continue PPI  Anxiety and Depression Continue Xanax for anxiety and Paxil Hold Remeron     Family Communication/Anticipated D/C date and plan/Code Status   DVT prophylaxis: Lovenox ordered. Code Status: Full Code.  Family Communication:  Disposition Plan: SNf?-- does not have 24/7 coverage   Medical Consultants:    None.     Subjective:   Wants to go home to her dog  Objective:    Vitals:   12/30/17 0004 12/30/17 0352 12/30/17 0807 12/30/17 1129  BP: 109/89 (!) 147/67 (!) 143/61 (!) 124/45  Pulse: (!) 103 73 78 75  Resp: 18 18 20 20   Temp: (!) 97.5 F (36.4 C) 97.6 F (36.4 C) 98.3 F (36.8 C) 98.3 F (36.8 C)  TempSrc: Oral Oral Oral Oral  SpO2: 100% 94% 96% 97%  Weight:  58.7 kg (129 lb 6.6 oz)    Height:        Intake/Output Summary (Last 24 hours) at 12/30/2017 1540 Last data filed at 12/30/2017 1345 Gross per 24 hour  Intake 340 ml  Output -  Net 340 ml   Filed Weights   12/29/17 1119 12/29/17 2028 12/30/17 0352  Weight: 59.9 kg (132 lb) 57.6 kg (126 lb 15.8 oz) 58.7 kg (129 lb 6.6 oz)    Exam: Pleasant and cooperative, appears younger than stated age rrr Clear No LE edema No focal neuro deficits  Data Reviewed:   I have personally reviewed following labs and imaging studies:  Labs: Labs show the following:   Basic Metabolic Panel: Recent Labs  Lab 12/29/17 1328  NA 136  K 3.8  CL 100*  CO2 25  GLUCOSE 89  BUN 13  CREATININE 0.84  CALCIUM 9.0   GFR Estimated Creatinine Clearance: 43.5 mL/min (by C-G formula based on SCr of 0.84 mg/dL). Liver Function Tests: No results for input(s): AST, ALT, ALKPHOS, BILITOT, PROT, ALBUMIN in the last 168 hours. No results for input(s): LIPASE,  AMYLASE in the last 168 hours. No results for input(s): AMMONIA in the last 168 hours. Coagulation profile No results for input(s): INR, PROTIME in the last 168 hours.  CBC: Recent Labs  Lab 12/29/17 1328  WBC 12.4*  NEUTROABS 9.8*  HGB 9.5*  HCT 31.6*  MCV 78.6  PLT 464*   Cardiac Enzymes: No results for input(s): CKTOTAL, CKMB, CKMBINDEX, TROPONINI in the last 168 hours. BNP (last 3 results) No results for input(s): PROBNP in the last 8760 hours. CBG: Recent Labs  Lab 12/29/17 1126 12/29/17 2044 12/30/17 0628 12/30/17 1113  GLUCAP 71 89 102* 141*   D-Dimer: No results for input(s): DDIMER in the last 72 hours. Hgb A1c: Recent Labs    12/29/17 1922  HGBA1C 5.7*   Lipid Profile: No results for input(s): CHOL, HDL, LDLCALC, TRIG, CHOLHDL, LDLDIRECT in the last 72 hours. Thyroid function studies: No results for input(s): TSH, T4TOTAL, T3FREE, THYROIDAB in the last 72 hours.  Invalid input(s): FREET3 Anemia work up: Recent Labs    12/29/17 1922  VITAMINB12 163*  FOLATE 11.0  FERRITIN 10*  TIBC 431  IRON 25*  RETICCTPCT 1.9   Sepsis Labs: Recent Labs  Lab 12/29/17 1328  WBC 12.4*    Microbiology No results found for this or any previous visit (from the past 240 hour(s)).  Procedures and diagnostic studies:  Dg Chest 2 View  Result Date: 12/29/2017 CLINICAL DATA:  Syncope EXAM: CHEST - 2 VIEW COMPARISON:  10/30/2017 FINDINGS: Left hemidiaphragm remains elevated with subsegmental atelectasis at the lung bases. Lungs are otherwise grossly clear. Mild cardiomegaly. Normal vascularity. No pneumothorax or pleural effusion. Lower thoracic compression deformity is stable. IMPRESSION: No active cardiopulmonary disease. Electronically Signed   By: Marybelle Killings M.D.   On: 12/29/2017 14:53   Ct Head Wo Contrast  Result Date: 12/29/2017 CLINICAL DATA:  Patient found by EMS under the dinning room table. Patient states she does not remember how she got there HX  HT, DM EXAM: CT HEAD WITHOUT CONTRAST TECHNIQUE: Contiguous axial images were obtained from the base of the skull through the vertex without intravenous contrast. COMPARISON:  None. FINDINGS: Brain: No evidence of acute infarction, hemorrhage, extra-axial collection, ventriculomegaly, or mass effect. Generalized cerebral atrophy. Periventricular white matter low attenuation likely secondary to microangiopathy. Vascular: Cerebrovascular atherosclerotic calcifications are noted. Skull: Negative for fracture or focal lesion. Sinuses/Orbits: Visualized portions of the orbits are unremarkable. Visualized portions of the paranasal sinuses and mastoid air cells are unremarkable. Other: None. IMPRESSION: 1. No acute intracranial pathology. Electronically Signed   By: Kathreen Devoid   On: 12/29/2017 17:23    Medications:   . aspirin EC  81 mg Oral Daily  . cholecalciferol  2,000 Units Oral Daily  . clopidogrel  75 mg Oral Daily  . cyanocobalamin  1,000 mcg Intramuscular Once  . docusate sodium  100 mg Oral BID  . enoxaparin (LOVENOX) injection  40 mg Subcutaneous Q24H  .  insulin aspart  0-9 Units Subcutaneous TID WC  . losartan  50 mg Oral Daily  . pantoprazole  40 mg Oral Daily  . PARoxetine  60 mg Oral Daily   Continuous Infusions:   LOS: 0 days   Geradine Girt  [facility]   *Please refer to [email or web address], password [email or web address] to get updated schedule on who will round on this patient, as hospitalists switch teams weekly. If 7PM-7AM, please contact night-coverage at [email or web address], password [email or web address] for any overnight needs.  12/30/2017, 3:40 PM

## 2017-12-30 NOTE — Evaluation (Signed)
Physical Therapy Evaluation Patient Details Name: Kari Colon MRN: [id number] DOB: 1935/04/05 Today's Date: 12/30/2017   History of Present Illness  Pt is an 83 y.o. female with PMH including GERD< hyperlipidemia, hypertension, history of factor V Leyden, with a remote history of mesenteric ischemia, macular degeneration, depression, and insomnia. She was brought to the ED via EMS after syncope with fall. Pt having increasing weakness lately with some reports of dizziness with head movements.   Clinical Impression  Patient presents without signs or symptoms of vertigo, but feels symptoms may have been due to medications.  Patient does report pre-syncopal symptoms so advised also to make sure she is drinking enough fluids.  Patient will benefit from continued skilled PT in the acute setting to address issues of weakness, imbalance, fall risk and poor safety awareness as well as follow up STSNF level rehab prior to d/c home.  Daughter working on getting 24 hour help set up at home for when d/c from rehab.      Follow Up Recommendations SNF    Equipment Recommendations  None recommended by PT    Recommendations for Other Services       Precautions / Restrictions Precautions Precautions: Fall      Mobility  Bed Mobility Overal bed mobility: Needs Assistance Bed Mobility: Supine to Sit;Sit to Supine     Supine to sit: Min guard     General bed mobility comments: struggling to get up from supine, did not need much help, but used rails and reported stiffness/soreness in back from falling; has been sleeping in recliner  Transfers Overall transfer level: Needs assistance Equipment used: Rolling walker (2 wheeled) Transfers: Sit to/from Stand Sit to Stand: Min guard         General transfer comment: assist for safety to rise from bed  Ambulation/Gait Ambulation/Gait assistance: Min assist Ambulation Distance (Feet): 200 Feet Assistive device: Rolling walker (2  wheeled) Gait Pattern/deviations: Narrow base of support;Drifts right/left;Decreased stride length;Scissoring;Shuffle     General Gait Details: at times crossing over with imbalance with ambulation, trying to enter several rooms along the way after reminded hers was last on the right, veers to sides with gait and postural abnormality due to severe scoliosis  Stairs            Wheelchair Mobility    Modified Rankin (Stroke Patients Only)       Balance Overall balance assessment: Needs assistance   Sitting balance-Leahy Scale: Fair     Standing balance support: Bilateral upper extremity supported;No upper extremity supported;During functional activity Standing balance-Leahy Scale: Fair Standing balance comment: turning to reach for her drink behind her without LOB, but with decreased safety awareness and high fall risk                             Pertinent Vitals/Pain Pain Assessment: Faces Faces Pain Scale: Hurts little more Pain Location: back and legs Pain Descriptors / Indicators: Aching;Sore Pain Intervention(s): Monitored during session;Repositioned    Home Living Family/patient expects to be discharged to:: Skilled nursing facility Living Arrangements: Alone Available Help at Discharge: Family;Available PRN/intermittently(daughter takes to appointments) Type of Home: Apartment Home Access: Level entry     Home Layout: One level Home Equipment: Cane - single point;Walker - 4 wheels      Prior Function Level of Independence: Needs assistance   Gait / Transfers Assistance Needed: uses RW sometimes but not at all times; supposed to use all the  time  ADL's / Homemaking Assistance Needed: independent for basic ADL; assistance for cleaning and cooking  Comments: working with HHPT PTA     Hand Dominance   Dominant Hand: Right    Extremity/Trunk Assessment   Upper Extremity Assessment Upper Extremity Assessment: Defer to OT evaluation     Lower Extremity Assessment Lower Extremity Assessment: Generalized weakness       Communication   Communication: No difficulties  Cognition Arousal/Alertness: Awake/alert Behavior During Therapy: WFL for tasks assessed/performed Overall Cognitive Status: Impaired/Different from baseline Area of Impairment: Memory;Safety/judgement                     Memory: Decreased short-term memory   Safety/Judgement: Decreased awareness of safety     General Comments: daughter reports decline steady since March and pt forgetting more and having trouble with meds      General Comments General comments (skin integrity, edema, etc.): daughter present and providing some history and current functional level; concerned she cannot get 24/7 help set up quickly and prefers STSNF initially; was getting [facility]  Vestibular Assessment - 12/30/17 1553      Vestibular Assessment   General Observation  Patient describes dizziness as light headedness that occurs getting up too quickly      Symptom Behavior   Type of Dizziness  Lightheadedness    Frequency of Dizziness  intermittent    Duration of Dizziness  minutes/seconds    Aggravating Factors  Supine to sit    Relieving Factors  Slow movements      Occulomotor Exam   Occulomotor Alignment  Normal    Spontaneous  Absent    Gaze-induced  Absent    Head shaking Horizontal  Absent    Smooth Pursuits  Saccades    Saccades  Slow      Vestibulo-Occular Reflex   VOR 1 Head Only (x 1 viewing)  performed with horizontal head movements x 30 sec no c/o symptoms    VOR to Slow Head Movement  Positive right;Negative left      Auditory   Comments  intact and equal both sides to scratch test      Positional Testing   Sidelying Test  Sidelying Right;Sidelying Left    Horizontal Canal Testing  Horizontal Canal Right;Horizontal Canal Left      Sidelying Right   Sidelying Right Duration  30 sec    Sidelying Right Symptoms  No nystagmus       Sidelying Left   Sidelying Left Duration  30 sec    Sidelying Left Symptoms  No nystagmus      Horizontal Canal Right   Horizontal Canal Right Duration  30 sec    Horizontal Canal Right Symptoms  Normal      Horizontal Canal Left   Horizontal Canal Left Duration  30 sec    Horizontal Canal Left Symptoms  Normal       Exercises     Assessment/Plan    PT Assessment Patient needs continued PT services  PT Problem List Decreased mobility;Decreased strength;Decreased activity tolerance;Decreased balance;Decreased knowledge of use of DME;Pain;Decreased safety awareness;Decreased cognition       PT Treatment Interventions DME instruction;Functional mobility training;Balance training;Patient/family education;Gait training;Therapeutic activities;Therapeutic exercise    PT Goals (Current goals can be found in the Care Plan section)  Acute Rehab PT Goals Patient Stated Goal: patient hoping for home, daughter wants initial placement to allow time to set up help at home PT Goal Formulation: With patient/family Time  For Goal Achievement: 01/13/18 Potential to Achieve Goals: Good    Frequency Min 3X/week   Barriers to discharge        Co-evaluation               AM-PAC PT "6 Clicks" Daily Activity  Outcome Measure Difficulty turning over in bed (including adjusting bedclothes, sheets and blankets)?: A Little Difficulty moving from lying on back to sitting on the side of the bed? : A Lot Difficulty sitting down on and standing up from a chair with arms (e.g., wheelchair, bedside commode, etc,.)?: Unable Help needed moving to and from a bed to chair (including a wheelchair)?: A Little Help needed walking in hospital room?: A Little Help needed climbing 3-5 steps with a railing? : A Lot 6 Click Score: 14    End of Session Equipment Utilized During Treatment: Gait belt Activity Tolerance: Patient tolerated treatment well Patient left: with call bell/phone within reach;in  bed;with bed alarm set   PT Visit Diagnosis: Other abnormalities of gait and mobility (R26.89);History of falling (Z91.81)    Time: 6384-6659 PT Time Calculation (min) (ACUTE ONLY): 43 min   Charges:   PT Evaluation $PT Eval Moderate Complexity: 1 Mod PT Treatments $Gait Training: 8-22 mins $Neuromuscular Re-education: 8-22 mins   PT G CodesMagda Colon, [place] [phone] 12/30/2017   Kari Colon 12/30/2017, 3:55 PM

## 2017-12-30 NOTE — Evaluation (Signed)
Occupational Therapy Evaluation Patient Details Name: Kari Colon MRN: [id number] DOB: 05-21-1935 Today's Date: 12/30/2017    History of Present Illness Pt is an 83 y.o. female with PMH including GERD< hyperlipidemia, hypertension, history of factor V Leyden, with a remote history of mesenteric ischemia, macular degeneration, depression, and insomnia. She was brought to the ED via EMS after syncope with fall. Pt having increasing weakness lately with some reports of dizziness with head movements.    Clinical Impression   PTA, pt was living alone in an apartment and completing basic ADL and functional mobility with RW at modified independent level. Her daughter assists with IADL and drives pt to all appointments. Pt reports limited tolerance for standing during cooking tasks and thus limits herself with meal prep. Pt currently presents with significantly diminished activity tolerance for ADL requiring min assist for toilet transfers and multiple therapeutic rest breaks throughout. She required mod assist for LB ADL and min guard assist for UB ADL. Pt would benefit from continued OT services while admitted to improve independence and safety with ADL and functional mobility. Pt very much would like to return home which is reasonable if able to have 24 hour assistance initially post-acute D/C. If able to have this level of assistance, recommend home health OT follow-up. If not, recommend short-term SNF level rehabilitation.    Follow Up Recommendations  Home health OT;Supervision/Assistance - 24 hour;SNF(SNF if unable to have 24 hour assist at home)    Equipment Recommendations  3 in 1 bedside commode;Tub/shower bench    Recommendations for Other Services       Precautions / Restrictions Precautions Precautions: Fall Restrictions Weight Bearing Restrictions: No      Mobility Bed Mobility Overal bed mobility: Modified Independent             General bed mobility comments: Modified  independent to sit at EOB. Increased time and effort, no physical assistance.   Transfers Overall transfer level: Needs assistance Equipment used: Rolling walker (2 wheeled) Transfers: Sit to/from [facility] Sit to Stand: Min guard Stand pivot transfers: Min guard       General transfer comment: MIn guard assist for safety to power up to standing.     Balance Overall balance assessment: Needs assistance Sitting-balance support: No upper extremity supported;Feet supported Sitting balance-Leahy Scale: Fair     Standing balance support: Bilateral upper extremity supported;No upper extremity supported;During functional activity Standing balance-Leahy Scale: Fair Standing balance comment: Able to statically stand without UE support.                            ADL either performed or assessed with clinical judgement   ADL Overall ADL's : Needs assistance/impaired Eating/Feeding: Set up;Sitting   Grooming: Minimal assistance;Standing Grooming Details (indicate cue type and reason): assist to maintain balance Upper Body Bathing: Min guard;Sitting   Lower Body Bathing: Sit to/from stand;Moderate assistance   Upper Body Dressing : Sitting;Min guard   Lower Body Dressing: Sit to/from stand;Moderate assistance   Toilet Transfer: Min guard;RW   Toileting- Clothing Manipulation and Hygiene: Min guard;Sitting/lateral lean       Functional mobility during ADLs: Minimal assistance;Rolling walker General ADL Comments: Pt fatigues very easily. She was able to complete stnad-pivot transfer from EOB to [facility] and then ambulate in room with significantly increased time and effort.      Vision Baseline Vision/History: Wears glasses Wears Glasses: (not while in the house) Patient Visual Report: No  change from baseline Vision Assessment?: No apparent visual deficits Additional Comments: No deficits noted but will continue to assess.      Perception     Praxis       Pertinent Vitals/Pain Pain Assessment: 0-10 Pain Score: 6  Pain Location: abdomen, back, bilateral legs Pain Descriptors / Indicators: Aching;Sore Pain Intervention(s): Limited activity within patient's tolerance;Monitored during session;Repositioned     Hand Dominance Right   Extremity/Trunk Assessment Upper Extremity Assessment Upper Extremity Assessment: Overall WFL for tasks assessed   Lower Extremity Assessment Lower Extremity Assessment: Generalized weakness(pain ful in hips)   Cervical / Trunk Assessment Cervical / Trunk Assessment: Other exceptions Cervical / Trunk Exceptions: chronic back pain   Communication Communication Communication: No difficulties   Cognition Arousal/Alertness: Awake/alert Behavior During Therapy: WFL for tasks assessed/performed Overall Cognitive Status: Within Functional Limits for tasks assessed                                 General Comments: Appears functional for ADL participation.    General Comments  Pt reports daughter is nearby and assists with meals and cleaning. Daughter drives pt to all appointment.     Exercises     Shoulder Instructions      Home Living Family/patient expects to be discharged to:: Private residence Living Arrangements: Alone Available Help at Discharge: Family;Available PRN/intermittently(daughter takes her to appointments; daughter is an Therapist, sports) Type of Home: Apartment Home Access: Level entry     Home Layout: One level     Bathroom Shower/Tub: Tub/shower unit(wants to get a seat)   Bathroom Toilet: Standard     Home Equipment: Environmental consultant - 2 wheels;Cane - single point(family getting grab bars for shower)   Additional Comments: planning to get toilet riser      Prior Functioning/Environment Level of Independence: Needs assistance  Gait / Transfers Assistance Needed: uses RW sometimes but not at all times; supposed to use all the time ADL's / Homemaking Assistance Needed:  independent for basic ADL; assistance for cleaning and cooking   Comments: working with HHPT PTA        OT Problem List: Decreased strength;Decreased activity tolerance;Impaired balance (sitting and/or standing);Decreased safety awareness;Decreased knowledge of use of DME or AE;Decreased knowledge of precautions;Pain      OT Treatment/Interventions: Self-care/ADL training;Therapeutic exercise;Energy conservation;DME and/or AE instruction;Therapeutic activities;Patient/family education;Balance training;Cognitive remediation/compensation    OT Goals(Current goals can be found in the care plan section) Acute Rehab OT Goals Patient Stated Goal: to go home  OT Goal Formulation: With patient Time For Goal Achievement: 01/13/18 Potential to Achieve Goals: Good ADL Goals Pt Will Perform Grooming: with modified independence;standing(3 consecutive tasks) Pt Will Perform Lower Body Dressing: with modified independence;sit to/from stand Pt Will Transfer to Toilet: with modified independence;ambulating;regular height toilet;bedside commode(BSC over toilet) Pt Will Perform Toileting - Clothing Manipulation and hygiene: with modified independence;sit to/from stand  OT Frequency: Min 2X/week   Barriers to D/C:            Co-evaluation              AM-PAC PT "6 Clicks" Daily Activity     Outcome Measure Help from another person eating meals?: None Help from another person taking care of personal grooming?: A Little Help from another person toileting, which includes using toliet, bedpan, or urinal?: A Little Help from another person bathing (including washing, rinsing, drying)?: A Little Help from another person to put on  and taking off regular upper body clothing?: A Lot Help from another person to put on and taking off regular lower body clothing?: A Little 6 Click Score: 18   End of Session Equipment Utilized During Treatment: Gait belt;Rolling walker Nurse Communication: Mobility  status  Activity Tolerance: Patient tolerated treatment well Patient left: in bed;with call bell/phone within reach;with bed alarm set  OT Visit Diagnosis: Other abnormalities of gait and mobility (R26.89);Pain Pain - Right/Left: Left Pain - part of body: Hip(bilateral hips; back; abdomen)                Time: 5038-8828 OT Time Calculation (min): 38 min Charges:  OT General Charges $OT Visit: 1 Visit OT Evaluation $OT Eval Moderate Complexity: 1 Mod OT Treatments $Self Care/Home Management : 23-37 mins G-Codes:     Norman Herrlich, MS OTR/L  Pager: [place] A Jhett Fretwell 12/30/2017, 1:01 PM

## 2017-12-31 ENCOUNTER — Encounter (HOSPITAL_COMMUNITY): Payer: Self-pay | Admitting: *Deleted

## 2017-12-31 DIAGNOSIS — D682 Hereditary deficiency of other clotting factors: Secondary | ICD-10-CM | POA: Diagnosis not present

## 2017-12-31 DIAGNOSIS — R55 Syncope and collapse: Secondary | ICD-10-CM | POA: Diagnosis not present

## 2017-12-31 DIAGNOSIS — F418 Other specified anxiety disorders: Secondary | ICD-10-CM | POA: Diagnosis not present

## 2017-12-31 DIAGNOSIS — K219 Gastro-esophageal reflux disease without esophagitis: Secondary | ICD-10-CM | POA: Diagnosis not present

## 2017-12-31 DIAGNOSIS — R011 Cardiac murmur, unspecified: Secondary | ICD-10-CM | POA: Diagnosis not present

## 2017-12-31 LAB — CBC
HEMATOCRIT: 27.5 % — AB (ref 36.0–46.0)
HEMOGLOBIN: 8.3 g/dL — AB (ref 12.0–15.0)
MCH: 23.9 pg — AB (ref 26.0–34.0)
MCHC: 30.2 g/dL (ref 30.0–36.0)
MCV: 79 fL (ref 78.0–100.0)
Platelets: 381 10*3/uL (ref 150–400)
RBC: 3.48 MIL/uL — AB (ref 3.87–5.11)
RDW: 15.8 % — ABNORMAL HIGH (ref 11.5–15.5)
WBC: 7.3 10*3/uL (ref 4.0–10.5)

## 2017-12-31 LAB — GLUCOSE, CAPILLARY
Glucose-Capillary: 108 mg/dL — ABNORMAL HIGH (ref 65–99)
Glucose-Capillary: 110 mg/dL — ABNORMAL HIGH (ref 65–99)
Glucose-Capillary: 134 mg/dL — ABNORMAL HIGH (ref 65–99)

## 2017-12-31 LAB — BASIC METABOLIC PANEL
Anion gap: 11 (ref 5–15)
BUN: 13 mg/dL (ref 6–20)
CALCIUM: 8.5 mg/dL — AB (ref 8.9–10.3)
CHLORIDE: 100 mmol/L — AB (ref 101–111)
CO2: 25 mmol/L (ref 22–32)
Creatinine, Ser: 0.77 mg/dL (ref 0.44–1.00)
GFR calc Af Amer: >60 mL/min (ref >60)
GLUCOSE: 117 mg/dL — AB (ref 65–99)
POTASSIUM: 3.6 mmol/L (ref 3.5–5.1)
SODIUM: 136 mmol/L (ref 135–145)

## 2017-12-31 MED ORDER — ISOSORBIDE MONONITRATE ER 30 MG PO TB24
30.0000 mg | ORAL_TABLET | Freq: Every day | ORAL | Status: DC
  Administered 2017-12-31 – 2018-01-02 (×3): 30 mg via ORAL
  Filled 2017-12-31 (×3): qty 1

## 2017-12-31 MED ORDER — GLYCERIN (LAXATIVE) 2.1 G RE SUPP
1.0000 | Freq: Once | RECTAL | Status: AC
  Administered 2017-12-31: 1 via RECTAL
  Filled 2017-12-31: qty 1

## 2017-12-31 NOTE — Progress Notes (Signed)
Progress Note    Kari Colon  UXL:[id number] DOB: May 14, 1935  DOA: 12/29/2017 PCP: Josetta Huddle, MD    Brief Narrative:    Medical records reviewed and are as summarized below:  Kari Colon is an 83 y.o. female with extensive medical history listed below, including GERD, hyperlipidemia, hypertension, history of factor V Leyden, with a remote history of mesenteric ischemia, macular degeneration, depression, insomnia, brought to the emergency department, after finding herself underneath her table.  The patient does not have any recall of falling, but somehow she landed on her lifeline, which was automatically pressed, for which EMS was able to assess her, and help her.  She denies any trauma to the head;  it appears that she had a syncopal episode.  The patient reports feeling very weak over the last few days, prior, and had one presyncopal episode a few weeks ago.  She denies any chest pain or palpitations.  She denies any nausea, vomiting, headaches or vision changes.  Of note, she reports intermittent episodes of what it sounds like vertigo, when moving her head "side-to-side "    Assessment/Plan:   Principal Problem:   Syncope Active Problems:   Carotid bruit   Depression with anxiety   Factor V deficiency (HCC)   GERD (gastroesophageal reflux disease)   Heart murmur  Syncope, unclear etiology, likely can be vasovagal, postural, drug-induced, anemia, or benign positional vertigo, versus infection, versus combination of all of the above.  -bilateral carotid ultrasound shows moderate right ICA stenosis, with mild L ICA stenosis. -30 day event monitor as an outpatient -orthostatics were positive- TED hose 2 D echo Hold BP meds Neuro checks PT/OT- 24 hour supervision/SNF. Hold Remeron and all other sedatives. -urine culture pending  B12 low -IM B12 replacement x 1 then weekly there after  Leukocytosis from UTI ?UTI- e coli on culture- having some retention -no  old cultures to compare sensitivites  Hypertension  -resume home meds  Type II Diabetes  Hgb A1C: 5.7 Hold home oral diabetic medications.  SSI  History of factor V Leyden Continue Plavix and aspirin  GERD, no acute symptoms Continue PPI  Anxiety and Depression Continue Xanax for anxiety and Paxil Hold Remeron     Family Communication/Anticipated D/C date and plan/Code Status   DVT prophylaxis: Lovenox ordered. Code Status: Full Code.  Family Communication:  Disposition Plan: SNF when bed available-- family to arrange 24 hour coverage   Medical Consultants:    None.     Subjective:   No chest pain, no dizziness  Objective:    Vitals:   12/31/17 0411 12/31/17 0411 12/31/17 0734 12/31/17 1248  BP:  (!) 162/52 (!) 183/72 (!) 174/79  Pulse:  70 73 75  Resp:  18 18 19   Temp:  98.1 F (36.7 C) 98.6 F (37 C) 98.2 F (36.8 C)  TempSrc:  Oral    SpO2:  92% 97% 95%  Weight: 60.3 kg (132 lb 15 oz)     Height:       No intake or output data in the 24 hours ending 12/31/17 1448 Filed Weights   12/29/17 2028 12/30/17 0352 12/31/17 0411  Weight: 57.6 kg (126 lb 15.8 oz) 58.7 kg (129 lb 6.6 oz) 60.3 kg (132 lb 15 oz)    Exam: NAD, pleasant and cooperative Rrr, +murmur +BS, soft, NT No LE edema   Data Reviewed:   I have personally reviewed following labs and imaging studies:  Labs: Labs show the following:  Basic Metabolic Panel: Recent Labs  Lab 12/29/17 1328 12/31/17 0503  NA 136 136  K 3.8 3.6  CL 100* 100*  CO2 25 25  GLUCOSE 89 117*  BUN 13 13  CREATININE 0.84 0.77  CALCIUM 9.0 8.5*   GFR Estimated Creatinine Clearance: 45.6 mL/min (by C-G formula based on SCr of 0.77 mg/dL). Liver Function Tests: No results for input(s): AST, ALT, ALKPHOS, BILITOT, PROT, ALBUMIN in the last 168 hours. No results for input(s): LIPASE, AMYLASE in the last 168 hours. No results for input(s): AMMONIA in the last 168 hours. Coagulation  profile No results for input(s): INR, PROTIME in the last 168 hours.  CBC: Recent Labs  Lab 12/29/17 1328 12/31/17 0503  WBC 12.4* 7.3  NEUTROABS 9.8*  --   HGB 9.5* 8.3*  HCT 31.6* 27.5*  MCV 78.6 79.0  PLT 464* 381   Cardiac Enzymes: No results for input(s): CKTOTAL, CKMB, CKMBINDEX, TROPONINI in the last 168 hours. BNP (last 3 results) No results for input(s): PROBNP in the last 8760 hours. CBG: Recent Labs  Lab 12/30/17 0628 12/30/17 1113 12/30/17 1618 12/30/17 2108 12/31/17 0621  GLUCAP 102* 141* 101* 110* 108*   D-Dimer: No results for input(s): DDIMER in the last 72 hours. Hgb A1c: Recent Labs    12/29/17 1922  HGBA1C 5.7*   Lipid Profile: No results for input(s): CHOL, HDL, LDLCALC, TRIG, CHOLHDL, LDLDIRECT in the last 72 hours. Thyroid function studies: No results for input(s): TSH, T4TOTAL, T3FREE, THYROIDAB in the last 72 hours.  Invalid input(s): FREET3 Anemia work up: Recent Labs    12/29/17 1922  VITAMINB12 163*  FOLATE 11.0  FERRITIN 10*  TIBC 431  IRON 25*  RETICCTPCT 1.9   Sepsis Labs: Recent Labs  Lab 12/29/17 1328 12/31/17 0503  WBC 12.4* 7.3    Microbiology Recent Results (from the past 240 hour(s))  Culture, Urine     Status: Abnormal (Preliminary result)   Collection Time: 12/30/17 11:50 AM  Result Value Ref Range Status   Specimen Description URINE, CLEAN CATCH  Final   Special Requests NONE  Final   Culture (A)  Final    >=100,000 COLONIES/mL ESCHERICHIA COLI SUSCEPTIBILITIES TO FOLLOW Performed at [facility], [address], [place], [place] [address]    Report Status PENDING  Incomplete    Procedures and diagnostic studies:  Ct Head Wo Contrast  Result Date: 12/29/2017 CLINICAL DATA:  Patient found by EMS under the dinning room table. Patient states she does not remember how she got there HX HT, DM EXAM: CT HEAD WITHOUT CONTRAST TECHNIQUE: Contiguous axial images were obtained from the base of the skull  through the vertex without intravenous contrast. COMPARISON:  None. FINDINGS: Brain: No evidence of acute infarction, hemorrhage, extra-axial collection, ventriculomegaly, or mass effect. Generalized cerebral atrophy. Periventricular white matter low attenuation likely secondary to microangiopathy. Vascular: Cerebrovascular atherosclerotic calcifications are noted. Skull: Negative for fracture or focal lesion. Sinuses/Orbits: Visualized portions of the orbits are unremarkable. Visualized portions of the paranasal sinuses and mastoid air cells are unremarkable. Other: None. IMPRESSION: 1. No acute intracranial pathology. Electronically Signed   By: Kathreen Devoid   On: 12/29/2017 17:23    Medications:   . aspirin EC  81 mg Oral Daily  . cholecalciferol  2,000 Units Oral Daily  . clopidogrel  75 mg Oral Daily  . docusate sodium  100 mg Oral BID  . enoxaparin (LOVENOX) injection  40 mg Subcutaneous Q24H  . insulin aspart  0-9 Units Subcutaneous TID WC  . isosorbide mononitrate  30 mg Oral Daily  . losartan  50 mg Oral Daily  . pantoprazole  40 mg Oral Daily  . PARoxetine  60 mg Oral Daily   Continuous Infusions:   LOS: 0 days   Geradine Girt  [facility]   *Please refer to [email or web address], password [email or web address] to get updated schedule on who will round on this patient, as hospitalists switch teams weekly. If 7PM-7AM, please contact night-coverage at [email or web address], password [email or web address] for any overnight needs.  12/31/2017, 2:48 PM

## 2017-12-31 NOTE — Care Management Obs Status (Signed)
[place] NOTIFICATION   Patient Details  Name: Kari Colon MRN: [id number] Date of Birth: May 09, 1935   Medicare Observation Status Notification Given:  Yes    Pollie Friar, RN 12/31/2017, 12:17 PM

## 2017-12-31 NOTE — Clinical Social Work Note (Signed)
Clinical Social Work Assessment  Patient Details  Name: Kari Colon MRN: [id number] Date of Birth: 12-Sep-1934  Date of referral:  12/31/17               Reason for consult:  Facility Placement                Permission sought to share information with:  Facility Sport and exercise psychologist, Family Supports Permission granted to share information::  Yes, Verbal Permission Granted  Name::     Pharmacist, community::  SNF  Relationship::  Daughter  Contact Information:     Housing/Transportation Living arrangements for the past 2 months:  [place] of Information:  Patient, Adult Children, Case Manager Patient Interpreter Needed:  None Criminal Activity/Legal Involvement Pertinent to Current Situation/Hospitalization:  No - Comment as needed Significant Relationships:  Adult Children Lives with:  Self, Pets Do you feel safe going back to the place where you live?  Yes Need for family participation in patient care:  Yes (Comment)(patient not fully oriented)  Care giving concerns:  Patient from home alone and needs SNF placement.   Social Worker assessment / plan:  CSW spoke with patient's daughter to discuss recommendation for SNF. CSW received permission to fax out referral and follow up with preference of [place]. CSW to follow.  Employment status:  Retired Nurse, adult PT Recommendations:  [place] / Referral to community resources:  [place]  Patient/Family's Response to care:  Patient and daughter agreeable to SNF placement.  Patient/Family's Understanding of and Emotional Response to Diagnosis, Current Treatment, and Prognosis:  Patient is not very realistic about her health and wants to go back home because she is worried about her dog, but is unable to care for herself at this time. Patient's daughter discussed that the preference for SNF would be [facility] because they had been there  before.  Emotional Assessment Appearance:  Appears stated age Attitude/Demeanor/Rapport:  Engaged Affect (typically observed):  Appropriate Orientation:  Oriented to Self, Oriented to Place, Oriented to Situation Alcohol / Substance use:  Not Applicable Psych involvement (Current and /or in the community):  No (Comment)  Discharge Needs  Concerns to be addressed:  Care Coordination Readmission within the last 30 days:  No Current discharge risk:  Dependent with Mobility, Lives alone Barriers to Discharge:  Continued Medical Work up, [place], [place] 12/31/2017, 12:44 PM

## 2017-12-31 NOTE — NC FL2 (Signed)
[place] MEDICAID FL2 LEVEL OF CARE SCREENING TOOL     IDENTIFICATION  Patient Name: Kari Colon Birthdate: 12/22/1934 Sex: female Admission Date (Current Location): 12/29/2017  [facility] and [place] Number:  Herbalist and Address:  [facility] [place], [place] [address], [place], [place] [address]      Provider Number: [id number]  Attending Physician Name and Address:  Geradine Girt, DO  Relative Name and Phone Number:       Current Level of Care: Hospital Recommended Level of Care: [place] Prior Approval Number:    Date Approved/Denied:   PASRR Number: [id number]  Discharge Plan: SNF    Current Diagnoses: Patient Active Problem List   Diagnosis Date Noted  . Syncope 12/29/2017  . Carotid bruit 12/29/2017  . Depression with anxiety 12/29/2017  . Factor V deficiency ([place]) 12/29/2017  . GERD (gastroesophageal reflux disease) 12/29/2017  . Heart murmur 12/29/2017    Orientation RESPIRATION BLADDER Height & Weight     Self, Situation, Place  Normal Continent Weight: 132 lb 15 oz (60.3 kg) Height:  5\' 3"  (160 cm)  BEHAVIORAL SYMPTOMS/MOOD NEUROLOGICAL BOWEL NUTRITION STATUS      Continent Diet(regular)  AMBULATORY STATUS COMMUNICATION OF NEEDS Skin   Limited Assist Verbally Normal                       Personal Care Assistance Level of Assistance  Bathing, Feeding, Dressing Bathing Assistance: Limited assistance Feeding assistance: Independent Dressing Assistance: Limited assistance     Functional Limitations Info  Sight, Hearing, Speech Sight Info: Adequate Hearing Info: Adequate Speech Info: Adequate    SPECIAL CARE FACTORS FREQUENCY  PT (By licensed PT), OT (By licensed OT)     PT Frequency: 5x/wk OT Frequency: 5x/wk            Contractures Contractures Info: Not present    Additional Factors Info  Code Status, Allergies, Psychotropic, Insulin Sliding Scale Code Status Info:  Full Allergies Info: Mirapex Pramipexole Dihydrochloride, Nexium Esomeprazole Magnesium, Prevacid Lansoprazole, Ticlid Ticlopidine, Wellbutrin Bupropion, Zantac Ranitidine Hcl, Amoxil Amoxicillin, Effexor Venlafaxine, Penicillins Psychotropic Info: Paxil 60mg  daily; Xanax 0.5 mg 3x/day PRN Insulin Sliding Scale Info: 0-9 units 3x/day with meals       Current Medications (12/31/2017):  This is the current hospital active medication list Current Facility-Administered Medications  Medication Dose Route Frequency Provider Last Rate Last Dose  . acetaminophen (TYLENOL) tablet 650 mg  650 mg Oral Q6H PRN Rondel Jumbo, PA-C   650 mg at 12/31/17 6834   Or  . acetaminophen (TYLENOL) suppository 650 mg  650 mg Rectal Q6H PRN Rondel Jumbo, PA-C      . ALPRAZolam Duanne Moron) tablet 0.5 mg  0.5 mg Oral TID PRN Sharene Butters E, PA-C   0.5 mg at 12/31/17 1962  . aspirin EC tablet 81 mg  81 mg Oral Daily Rondel Jumbo, PA-C   81 mg at 12/31/17 2297  . cholecalciferol (VITAMIN D) tablet 2,000 Units  2,000 Units Oral Daily Rondel Jumbo, PA-C   2,000 Units at 12/31/17 9892  . clopidogrel (PLAVIX) tablet 75 mg  75 mg Oral Daily Rondel Jumbo, PA-C   75 mg at 12/31/17 0913  . docusate sodium (COLACE) capsule 100 mg  100 mg Oral BID Rondel Jumbo, PA-C   100 mg at 12/31/17 0913  . enoxaparin (LOVENOX) injection 40 mg  40 mg Subcutaneous Q24H Rondel Jumbo, PA-C  40 mg at 12/30/17 2124  . Glycerin (Adult) 2.1 g suppository 1 suppository  1 suppository Rectal Once Vann, Jessica U, DO      . insulin aspart (novoLOG) injection 0-9 Units  0-9 Units Subcutaneous TID WC Rondel Jumbo, PA-C   1 Units at 12/30/17 1206  . isosorbide mononitrate (IMDUR) 24 hr tablet 30 mg  30 mg Oral Daily Vann, Jessica U, DO      . losartan (COZAAR) tablet 50 mg  50 mg Oral Daily Rondel Jumbo, PA-C   50 mg at 12/31/17 1245  . ondansetron (ZOFRAN) tablet 4 mg  4 mg Oral Q6H PRN Rondel Jumbo, PA-C       Or  . ondansetron  (ZOFRAN) injection 4 mg  4 mg Intravenous Q6H PRN Rondel Jumbo, PA-C      . pantoprazole (PROTONIX) EC tablet 40 mg  40 mg Oral Daily Rondel Jumbo, PA-C   40 mg at 12/31/17 0913  . PARoxetine (PAXIL) tablet 60 mg  60 mg Oral Daily Rondel Jumbo, PA-C   60 mg at 12/31/17 0913  . polyethylene glycol (MIRALAX / GLYCOLAX) packet 17 g  17 g Oral Daily PRN Rondel Jumbo, PA-C   17 g at 12/31/17 0915  . traMADol (ULTRAM) tablet 100 mg  100 mg Oral Q8H PRN Rondel Jumbo, PA-C   100 mg at 12/30/17 0541     Discharge Medications: Please see discharge summary for a list of discharge medications.  Relevant Imaging Results:  Relevant Lab Results:   Additional Information SS#: [id number]  Geralynn Ochs, LCSW

## 2018-01-01 DIAGNOSIS — E118 Type 2 diabetes mellitus with unspecified complications: Secondary | ICD-10-CM | POA: Diagnosis not present

## 2018-01-01 DIAGNOSIS — R55 Syncope and collapse: Secondary | ICD-10-CM | POA: Diagnosis not present

## 2018-01-01 DIAGNOSIS — K219 Gastro-esophageal reflux disease without esophagitis: Secondary | ICD-10-CM | POA: Diagnosis not present

## 2018-01-01 DIAGNOSIS — D682 Hereditary deficiency of other clotting factors: Secondary | ICD-10-CM | POA: Diagnosis not present

## 2018-01-01 DIAGNOSIS — F418 Other specified anxiety disorders: Secondary | ICD-10-CM | POA: Diagnosis not present

## 2018-01-01 LAB — URINE CULTURE: Culture: >=100000 — AB

## 2018-01-01 LAB — GLUCOSE, CAPILLARY
GLUCOSE-CAPILLARY: 107 mg/dL — AB (ref 65–99)
GLUCOSE-CAPILLARY: 134 mg/dL — AB (ref 65–99)
GLUCOSE-CAPILLARY: 114 mg/dL — AB (ref 65–99)
Glucose-Capillary: 107 mg/dL — ABNORMAL HIGH (ref 65–99)

## 2018-01-01 MED ORDER — POLYETHYLENE GLYCOL 3350 17 G PO PACK: 17.0000 g | PACK | Freq: Every day | ORAL | 0 refills | Status: DC | PRN

## 2018-01-01 MED ORDER — CEPHALEXIN 500 MG PO CAPS
500.0000 mg | ORAL_CAPSULE | Freq: Two times a day (BID) | ORAL | Status: DC
  Administered 2018-01-01 – 2018-01-02 (×3): 500 mg via ORAL
  Filled 2018-01-01 (×3): qty 1

## 2018-01-01 MED ORDER — CEPHALEXIN 500 MG PO CAPS: 500.0000 mg | ORAL_CAPSULE | Freq: Two times a day (BID) | ORAL | Status: DC

## 2018-01-01 MED ORDER — DOCUSATE SODIUM 100 MG PO CAPS: 100.0000 mg | ORAL_CAPSULE | Freq: Two times a day (BID) | ORAL | 0 refills | Status: DC

## 2018-01-01 MED ORDER — TRAMADOL HCL 50 MG PO TABS: 100.0000 mg | ORAL_TABLET | Freq: Three times a day (TID) | ORAL | 0 refills | Status: DC | PRN

## 2018-01-01 NOTE — Discharge Summary (Addendum)
Physician Discharge Summary  Kari Colon TDD:[id number] DOB: 1935/03/27 DOA: 12/29/2017  PCP: Josetta Huddle, MD  Admit date: 12/29/2017 Discharge date: 01/02/2018  Admitted From: home Discharge disposition: SNF  Patient seen this morning, please refer to discharge summary by Dr. Lucianne Lei, stable for discharge when bed available  BP (!) 164/81 (BP Location: Left Arm)   Pulse 84   Temp 98 F (36.7 C) (Oral)   Resp 18   Ht 5\' 3"  (1.6 m)   Wt 60.3 kg (132 lb 15 oz)   SpO2 96%   BMI 23.55 kg/m   Ibn Stief M. Cruzita Lederer, MD [facility] [phone]  If 7PM-7AM, please contact night-coverage [email or web address] Password [email or web address]   Recommendations for Outpatient Follow-Up:   1. Keflex until 6/1 2. Weekly IM B12 replacement x 4 weeks then monthly 3. TED hose   Discharge Diagnosis:   Principal Problem:   Syncope Active Problems:   Carotid bruit   Depression with anxiety   Factor V deficiency (HCC)   GERD (gastroesophageal reflux disease)   Heart murmur    Discharge Condition: Improved.  Diet recommendation: Low sodium, heart healthy.  Carbohydrate-modified.   Wound care: None.  Code status: Full.   History of Present Illness:   Per Dr. Jonnie Finner: Kari Colon is a 83 y.o. female with extensive medical history listed below, including GERD, hyperlipidemia, hypertension, history of factor V Leyden, with a remote history of mesenteric ischemia, macular degeneration, depression, insomnia, brought to the emergency department, after finding herself underneath her table.  The patient does not have any recall of falling, but somehow she landed on her lifeline, which was automatically pressed, for which EMS was able to assess her, and help her.  She denies any trauma to the head;  it appears that she had a syncopal episode.  The patient reports feeling very weak over the last few days, prior, and had one presyncopal episode a few weeks ago.  She denies any chest pain or  palpitations.  She denies any nausea, vomiting, headaches or vision changes.  Of note, she reports intermittent episodes of what it sounds like vertigo, when moving her head "side-to-side ", but she does not report any vertigo while lying down and this interview.  No confusion is reported.  She denies any abdominal pain.  No dysphagia.  She denies any dysuria or gross hematuria.  She denies any lower extremity swelling or calf pain.  She denies any history of PE or DVT.  Patient is fairly independent, and denies any unilaterShe denies any dysphagia.  Tobacco, alcohol or recreational drug use.     Hospital Course by Problem:   Syncope,unclear etiology, likely can be vasovagal, postural, drug-induced, anemia, or benign positional vertigo, versus infection,versus combination of all of the above.  -bilateral carotid ultrasound shows moderate right ICA stenosis, with mild L ICA stenosis. -30 day event monitor as an outpatient -orthostatics were positive- TED hose 2 D echo: - Left ventricle: The cavity size was normal. There was mild focal   basal hypertrophy of the septum. Systolic function was vigorous.   The estimated ejection fraction was in the range of 65% to 70%.   Wall motion was normal; there were no regional wall motion   abnormalities. Doppler parameters are consistent with abnormal   left ventricular relaxation (grade 1 diastolic dysfunction).   Doppler parameters are consistent with elevated ventricular   end-diastolic filling pressure. To SNF  B12 low -IM B12 replacement x 1 then weekly there after  Leukocytosis from UTI Culture with e coli- having some retention -keflex x 3 days  Hypertension -resume home meds  Type II Diabetes  Hgb A1C: 5.7 Resume home meds  History of factor V Leyden Continue Plavix and aspirin  GERD,no acute symptoms Continue PPI  Anxiety and Depression Continue Xanax for anxietyand Paxil Hold Remeron for now        Medical  Consultants:   none   Discharge Exam:   Vitals:   01/01/18 0735 01/01/18 1140  BP: (!) 157/86 (!) 150/53  Pulse: 74 74  Resp: 18   Temp: 98.6 F (37 C) 98.1 F (36.7 C)  SpO2: 97% 95%   Vitals:   12/31/17 2020 12/31/17 2354 01/01/18 0735 01/01/18 1140  BP: (!) 154/65 (!) 162/63 (!) 157/86 (!) 150/53  Pulse: 69 77 74 74  Resp: 14 18 18    Temp: 98.2 F (36.8 C) 98.3 F (36.8 C) 98.6 F (37 C) 98.1 F (36.7 C)  TempSrc: Oral Oral Oral Oral  SpO2: 97% 90% 97% 95%  Weight:      Height:        General exam: Appears calm and comfortable.    The results of significant diagnostics from this hospitalization (including imaging, microbiology, ancillary and laboratory) are listed below for reference.     Procedures and Diagnostic Studies:   Dg Chest 2 View  Result Date: 12/29/2017 CLINICAL DATA:  Syncope EXAM: CHEST - 2 VIEW COMPARISON:  10/30/2017 FINDINGS: Left hemidiaphragm remains elevated with subsegmental atelectasis at the lung bases. Lungs are otherwise grossly clear. Mild cardiomegaly. Normal vascularity. No pneumothorax or pleural effusion. Lower thoracic compression deformity is stable. IMPRESSION: No active cardiopulmonary disease. Electronically Signed   By: Marybelle Killings M.D.   On: 12/29/2017 14:53   Ct Head Wo Contrast  Result Date: 12/29/2017 CLINICAL DATA:  Patient found by EMS under the dinning room table. Patient states she does not remember how she got there HX HT, DM EXAM: CT HEAD WITHOUT CONTRAST TECHNIQUE: Contiguous axial images were obtained from the base of the skull through the vertex without intravenous contrast. COMPARISON:  None. FINDINGS: Brain: No evidence of acute infarction, hemorrhage, extra-axial collection, ventriculomegaly, or mass effect. Generalized cerebral atrophy. Periventricular white matter low attenuation likely secondary to microangiopathy. Vascular: Cerebrovascular atherosclerotic calcifications are noted. Skull: Negative for fracture  or focal lesion. Sinuses/Orbits: Visualized portions of the orbits are unremarkable. Visualized portions of the paranasal sinuses and mastoid air cells are unremarkable. Other: None. IMPRESSION: 1. No acute intracranial pathology. Electronically Signed   By: Kathreen Devoid   On: 12/29/2017 17:23     Labs:   Basic Metabolic Panel: Recent Labs  Lab 12/29/17 1328 12/31/17 0503  NA 136 136  K 3.8 3.6  CL 100* 100*  CO2 25 25  GLUCOSE 89 117*  BUN 13 13  CREATININE 0.84 0.77  CALCIUM 9.0 8.5*   GFR Estimated Creatinine Clearance: 45.6 mL/min (by C-G formula based on SCr of 0.77 mg/dL). Liver Function Tests: No results for input(s): AST, ALT, ALKPHOS, BILITOT, PROT, ALBUMIN in the last 168 hours. No results for input(s): LIPASE, AMYLASE in the last 168 hours. No results for input(s): AMMONIA in the last 168 hours. Coagulation profile No results for input(s): INR, PROTIME in the last 168 hours.  CBC: Recent Labs  Lab 12/29/17 1328 12/31/17 0503  WBC 12.4* 7.3  NEUTROABS 9.8*  --   HGB 9.5* 8.3*  HCT 31.6* 27.5*  MCV 78.6 79.0  PLT 464* 381  Cardiac Enzymes: No results for input(s): CKTOTAL, CKMB, CKMBINDEX, TROPONINI in the last 168 hours. BNP: Invalid input(s): POCBNP CBG: Recent Labs  Lab 12/31/17 0621 12/31/17 1700 12/31/17 2124 01/01/18 0621 01/01/18 1113  GLUCAP 108* 110* 134* 107* 107*   D-Dimer No results for input(s): DDIMER in the last 72 hours. Hgb A1c Recent Labs    12/29/17 1922  HGBA1C 5.7*   Lipid Profile No results for input(s): CHOL, HDL, LDLCALC, TRIG, CHOLHDL, LDLDIRECT in the last 72 hours. Thyroid function studies No results for input(s): TSH, T4TOTAL, T3FREE, THYROIDAB in the last 72 hours.  Invalid input(s): FREET3 Anemia work up [employer]    12/29/17 1922  VITAMINB12 163*  FOLATE 11.0  FERRITIN 10*  TIBC 431  IRON 25*  RETICCTPCT 1.9   Microbiology Recent Results (from the past 240 hour(s))  Culture, Urine     Status:  Abnormal   Collection Time: 12/30/17 11:50 AM  Result Value Ref Range Status   Specimen Description URINE, CLEAN CATCH  Final   Special Requests   Final    NONE Performed at [facility], [address], [place], [place] [address]    Culture >=100,000 COLONIES/mL ESCHERICHIA COLI (A)  Final   Report Status 01/01/2018 FINAL  Final   Organism ID, Bacteria ESCHERICHIA COLI (A)  Final      Susceptibility   Escherichia coli - MIC*    AMPICILLIN <=2 SENSITIVE Sensitive     CEFAZOLIN <=4 SENSITIVE Sensitive     CEFTRIAXONE <=1 SENSITIVE Sensitive     CIPROFLOXACIN <=0.25 SENSITIVE Sensitive     GENTAMICIN <=1 SENSITIVE Sensitive     IMIPENEM <=0.25 SENSITIVE Sensitive     NITROFURANTOIN <=16 SENSITIVE Sensitive     TRIMETH/SULFA <=20 SENSITIVE Sensitive     AMPICILLIN/SULBACTAM <=2 SENSITIVE Sensitive     PIP/TAZO <=4 SENSITIVE Sensitive     Extended ESBL NEGATIVE Sensitive     * >=100,000 COLONIES/mL ESCHERICHIA COLI     Discharge Instructions:   Discharge Instructions    Diet - low sodium heart healthy   Complete by:  As directed    Diet Carb Modified   Complete by:  As directed    Increase activity slowly   Complete by:  As directed      Allergies as of 01/01/2018      Reactions   Mirapex [pramipexole Dihydrochloride] Other (See Comments)   insomnia   Nexium [esomeprazole Magnesium] Other (See Comments)   Ache in chest   Prevacid [lansoprazole] Other (See Comments)   Ache in chest   Ticlid [ticlopidine] Hives   Wellbutrin [bupropion] Hives, Other (See Comments)   lightheadedness   Zantac [ranitidine Hcl] Hives   Amoxil [amoxicillin] Rash   Effexor [venlafaxine] Rash   Penicillins Rash   Has patient had a PCN reaction causing immediate rash, facial/tongue/throat swelling, SOB or lightheadedness with hypotension: Yes Has patient had a PCN reaction causing severe rash involving mucus membranes or skin necrosis: No Has patient had a PCN reaction that required  hospitalization: No Has patient had a PCN reaction occurring within the last 10 years: No If all of the above answers are "NO", then may proceed with Cephalosporin use.      Medication List    STOP taking these medications   mirtazapine 30 MG tablet Commonly known as:  REMERON   ondansetron 8 MG disintegrating tablet Commonly known as:  ZOFRAN ODT   zolpidem 10 MG tablet Commonly known as:  AMBIEN  TAKE these medications   acetaminophen 650 MG CR tablet Commonly known as:  TYLENOL Take 1,300 mg by mouth every 8 (eight) hours as needed for pain.   ALPRAZolam 0.5 MG tablet Commonly known as:  XANAX Take 0.5 mg by mouth 3 (three) times daily as needed for anxiety.   aspirin EC 81 MG tablet Take 81 mg by mouth daily.   cephALEXin 500 MG capsule Commonly known as:  KEFLEX Take 1 capsule (500 mg total) by mouth every 12 (twelve) hours.   clopidogrel 75 MG tablet Commonly known as:  PLAVIX Take 75 mg by mouth daily.   docusate sodium 100 MG capsule Commonly known as:  COLACE Take 1 capsule (100 mg total) by mouth 2 (two) times daily.   isosorbide mononitrate 30 MG 24 hr tablet Commonly known as:  IMDUR Take 30 mg by mouth daily.   losartan 50 MG tablet Commonly known as:  COZAAR Take 50 mg by mouth daily.   metFORMIN 500 MG tablet Commonly known as:  GLUCOPHAGE Take 1,000 mg by mouth 2 (two) times daily.   PARoxetine 30 MG tablet Commonly known as:  PAXIL Take 60 mg by mouth daily.   polyethylene glycol packet Commonly known as:  MIRALAX / GLYCOLAX Take 17 g by mouth daily as needed for mild constipation.   RABEprazole 20 MG tablet Commonly known as:  ACIPHEX Take 20 mg by mouth daily.   traMADol 50 MG tablet Commonly known as:  ULTRAM Take 2 tablets (100 mg total) by mouth every 8 (eight) hours as needed (pain).   Vitamin D 2000 units tablet Take 2,000 Units by mouth daily.       Contact information for follow-up providers    Josetta Huddle, MD  Follow up in 1 week(s).   Specialty:  Internal Medicine Contact information: [address]  [place] [address] [phone]            Contact information for after-discharge care    Destination    [facility] .   Service:  Skilled Nursing Contact information: [place] [phone]                   Time coordinating discharge: 35 min  Signed:  Geradine Girt  [facility] 01/01/2018, 12:31 PM

## 2018-01-01 NOTE — Progress Notes (Signed)
Physical Therapy Treatment Patient Details Name: Kari Colon MRN: [id number] DOB: 10-Apr-1935 Today's Date: 01/01/2018    History of Present Illness Pt is an 83 y.o. female with PMH including GERD< hyperlipidemia, hypertension, history of factor V Leyden, with a remote history of mesenteric ischemia, macular degeneration, depression, and insomnia. She was brought to the ED via EMS after syncope with fall. Pt having increasing weakness lately with some reports of dizziness with head movements.     PT Comments    Patient is making progress toward mobility goals. Pt does continue to demonstrate balance deficits and decreased awareness of safety. Continue to progress as tolerated with anticipated d/c to SNF for further skilled PT services.     Follow Up Recommendations  SNF     Equipment Recommendations  None recommended by PT    Recommendations for Other Services       Precautions / Restrictions Precautions Precautions: Fall Restrictions Weight Bearing Restrictions: No    Mobility  Bed Mobility               General bed mobility comments: pt OOB in chair upon arrival  Transfers Overall transfer level: Needs assistance Equipment used: Rolling walker (2 wheeled) Transfers: Sit to/from Stand Sit to Stand: Min guard         General transfer comment: min guard for safety  Ambulation/Gait Ambulation/Gait assistance: Min assist   Assistive device: Rolling walker (2 wheeled) Gait Pattern/deviations: Narrow base of support;Drifts right/left;Decreased stride length;Step-through pattern Gait velocity: decreased   General Gait Details: cues for posture and safe use of AD as well as directional cues in hallway as pt tends to drift far R and L and runs into object with RW wheel at times; pt taking one hand off of RW and talking and becoming more unsteady   Stairs             Wheelchair Mobility    Modified Rankin (Stroke Patients Only)       Balance  Overall balance assessment: Needs assistance Sitting-balance support: No upper extremity supported;Feet supported Sitting balance-Leahy Scale: Fair     Standing balance support: Bilateral upper extremity supported;No upper extremity supported;During functional activity Standing balance-Leahy Scale: Fair                              Cognition Arousal/Alertness: Awake/alert Behavior During Therapy: WFL for tasks assessed/performed Overall Cognitive Status: Impaired/Different from baseline Area of Impairment: Memory;Safety/judgement                     Memory: Decreased short-term memory   Safety/Judgement: Decreased awareness of safety;Decreased awareness of deficits            Exercises      General Comments        Pertinent Vitals/Pain Pain Assessment: Faces Faces Pain Scale: Hurts little more Pain Location: back and legs Pain Descriptors / Indicators: Aching;Sore Pain Intervention(s): Limited activity within patient's tolerance;Repositioned    Home Living                      Prior Function            PT Goals (current goals can now be found in the care plan section) Acute Rehab PT Goals PT Goal Formulation: With patient/family Time For Goal Achievement: 01/13/18 Potential to Achieve Goals: Good Progress towards PT goals: Progressing toward goals    Frequency  Min 3X/week      PT Plan Current plan remains appropriate    Co-evaluation              AM-PAC PT "6 Clicks" Daily Activity  Outcome Measure  Difficulty turning over in bed (including adjusting bedclothes, sheets and blankets)?: A Little Difficulty moving from lying on back to sitting on the side of the bed? : A Lot Difficulty sitting down on and standing up from a chair with arms (e.g., wheelchair, bedside commode, etc,.)?: Unable Help needed moving to and from a bed to chair (including a wheelchair)?: A Little Help needed walking in hospital room?: A  Little Help needed climbing 3-5 steps with a railing? : A Lot 6 Click Score: 14    End of Session Equipment Utilized During Treatment: Gait belt Activity Tolerance: Patient tolerated treatment well Patient left: with call bell/phone within reach;in chair Nurse Communication: Mobility status PT Visit Diagnosis: Other abnormalities of gait and mobility (R26.89);History of falling (Z91.81)     Time: 2992-4268 PT Time Calculation (min) (ACUTE ONLY): 26 min  Charges:  $Gait Training: 8-22 mins $Therapeutic Activity: 8-22 mins                    G Codes:       Earney Navy, PTA Pager: [phone]     [place]iss Cheney 01/01/2018, 3:50 PM

## 2018-01-01 NOTE — Progress Notes (Signed)
CSW following for discharge plan. CSW called earlier this morning to [facility] (managing [facility]) to check on status of patient's authorization. Per representative, request had been received and was still under review. CSW called this afternoon again to check on status, and spoke to clinical representative Jody. Per Jeral Fruit, case had not yet been assigned for anyone to look at. Authorization will not be received today for patient to admit to SNF.  CSW contacted patient's daughter to discuss that patient's case had not yet been reviewed, and that the patient had been discharged. Patient's daughter said she's unable to take the patient home at this time, will need time to set up caregivers if the patient were to come home.   CSW to continue to follow.  Laveda Abbe, [place] Clinical Social Worker [phone]

## 2018-01-01 NOTE — Progress Notes (Signed)
Occupational Therapy Treatment Patient Details Name: Kari Colon MRN: [id number] DOB: 1934/11/08 Today's Date: 01/01/2018    History of present illness Pt is an 83 y.o. female with PMH including GERD< hyperlipidemia, hypertension, history of factor V [place], with a remote history of mesenteric ischemia, macular degeneration, depression, and insomnia. She was brought to the ED via EMS after syncope with fall. Pt having increasing weakness lately with some reports of dizziness with head movements.    OT comments  Pt progressing towards OT goals this session. Able to demonstrate UB and LB bathing and dressing, toilet transfer, and sink level grooming. Pt overall at min guard level with mod A for LB dressing. Pt is heavily dependent on grab bars in bathroom for transfers and Pt continues to fatigue requiring therapeutic rest breaks. OT will continue to follow acutely, and Pt continues to require SNF level care at dc.   Follow Up Recommendations  SNF;Supervision/Assistance - 24 hour    Equipment Recommendations  3 in 1 bedside commode;Tub/shower bench    Recommendations for Other Services      Precautions / Restrictions Precautions Precautions: Fall Restrictions Weight Bearing Restrictions: No       Mobility Bed Mobility               General bed mobility comments: pt OOB in chair upon arrival  Transfers Overall transfer level: Needs assistance Equipment used: Rolling walker (2 wheeled) Transfers: Sit to/from Stand Sit to Stand: Min guard         General transfer comment: min guard for safety    Balance Overall balance assessment: Needs assistance Sitting-balance support: No upper extremity supported;Feet supported Sitting balance-Leahy Scale: Fair     Standing balance support: Bilateral upper extremity supported;No upper extremity supported;During functional activity Standing balance-Leahy Scale: Fair                             ADL either  performed or assessed with clinical judgement   ADL Overall ADL's : Needs assistance/impaired     Grooming: Min guard;Standing;Applying deodorant;Wash/dry hands Grooming Details (indicate cue type and reason): min guard for safety Upper Body Bathing: Min guard;Sitting   Lower Body Bathing: Min guard;Minimal assistance;Sit to/from stand Lower Body Bathing Details (indicate cue type and reason): able to wash lower body using grab bars in bathroom Upper Body Dressing : Sitting;Min guard Upper Body Dressing Details (indicate cue type and reason): to don dress Lower Body Dressing: Sit to/from stand;Minimal assistance Lower Body Dressing Details (indicate cue type and reason): to don adult diapers and shoes, heavily dependent on grab bar for safety Toilet Transfer: Min guard;RW   Toileting- Clothing Manipulation and Hygiene: Min guard;Sit to/from stand Toileting - Clothing Manipulation Details (indicate cue type and reason): use of grab bar     Functional mobility during ADLs: Min guard;Rolling walker;Cueing for safety General ADL Comments: Pt continues to fatigue easily and is very eager to get back to her dog Oncologist      Cognition Arousal/Alertness: Awake/alert Behavior During Therapy: WFL for tasks assessed/performed Overall Cognitive Status: Impaired/Different from baseline Area of Impairment: Memory;Safety/judgement                     Memory: Decreased short-term memory   Safety/Judgement: Decreased awareness of safety;Decreased awareness of deficits  Exercises     Shoulder Instructions       General Comments      Pertinent Vitals/ Pain       Pain Assessment: Faces Faces Pain Scale: Hurts little more Pain Location: back and legs Pain Descriptors / Indicators: Aching;Sore Pain Intervention(s): Limited activity within patient's tolerance;Monitored during session;Repositioned  Home Living                                           Prior Functioning/Environment              Frequency  Min 2X/week        Progress Toward Goals  OT Goals(current goals can now be found in the care plan section)  Progress towards OT goals: Progressing toward goals  Acute Rehab OT Goals Patient Stated Goal: to get home to dog Barnabas Lister OT Goal Formulation: With patient Time For Goal Achievement: 01/13/18 Potential to Achieve Goals: Good  Plan Discharge plan remains appropriate;Frequency remains appropriate    Co-evaluation                 AM-PAC PT "6 Clicks" Daily Activity     Outcome Measure   Help from another person eating meals?: None Help from another person taking care of personal grooming?: A Little Help from another person toileting, which includes using toliet, bedpan, or urinal?: A Little Help from another person bathing (including washing, rinsing, drying)?: A Little Help from another person to put on and taking off regular upper body clothing?: A Lot Help from another person to put on and taking off regular lower body clothing?: A Little 6 Click Score: 18    End of Session Equipment Utilized During Treatment: Gait belt;Rolling walker  OT Visit Diagnosis: Other abnormalities of gait and mobility (R26.89);Pain   Activity Tolerance Patient tolerated treatment well   Patient Left in chair;with call bell/phone within reach   Nurse Communication Mobility status        Time: 3235-5732 OT Time Calculation (min): 29 min  Charges: OT General Charges $OT Visit: 1 Visit OT Treatments $Self Care/Home Management : 23-37 mins  Hulda Humphrey OTR/L [place] 01/01/2018, 5:33 PM

## 2018-01-02 DIAGNOSIS — S20229A Contusion of unspecified back wall of thorax, initial encounter: Secondary | ICD-10-CM | POA: Diagnosis not present

## 2018-01-02 DIAGNOSIS — Z9181 History of falling: Secondary | ICD-10-CM | POA: Diagnosis not present

## 2018-01-02 DIAGNOSIS — Z79899 Other long term (current) drug therapy: Secondary | ICD-10-CM | POA: Diagnosis not present

## 2018-01-02 DIAGNOSIS — D510 Vitamin B12 deficiency anemia due to intrinsic factor deficiency: Secondary | ICD-10-CM | POA: Diagnosis not present

## 2018-01-02 DIAGNOSIS — D6851 Activated protein C resistance: Secondary | ICD-10-CM | POA: Diagnosis not present

## 2018-01-02 DIAGNOSIS — Z743 Need for continuous supervision: Secondary | ICD-10-CM | POA: Diagnosis not present

## 2018-01-02 DIAGNOSIS — R2689 Other abnormalities of gait and mobility: Secondary | ICD-10-CM | POA: Diagnosis not present

## 2018-01-02 DIAGNOSIS — K219 Gastro-esophageal reflux disease without esophagitis: Secondary | ICD-10-CM | POA: Diagnosis not present

## 2018-01-02 DIAGNOSIS — R0989 Other specified symptoms and signs involving the circulatory and respiratory systems: Secondary | ICD-10-CM | POA: Diagnosis not present

## 2018-01-02 DIAGNOSIS — I773 Arterial fibromuscular dysplasia: Secondary | ICD-10-CM | POA: Diagnosis not present

## 2018-01-02 DIAGNOSIS — R279 Unspecified lack of coordination: Secondary | ICD-10-CM | POA: Diagnosis not present

## 2018-01-02 DIAGNOSIS — R41841 Cognitive communication deficit: Secondary | ICD-10-CM | POA: Diagnosis not present

## 2018-01-02 DIAGNOSIS — D682 Hereditary deficiency of other clotting factors: Secondary | ICD-10-CM | POA: Diagnosis not present

## 2018-01-02 DIAGNOSIS — Z7984 Long term (current) use of oral hypoglycemic drugs: Secondary | ICD-10-CM | POA: Diagnosis not present

## 2018-01-02 DIAGNOSIS — F17210 Nicotine dependence, cigarettes, uncomplicated: Secondary | ICD-10-CM | POA: Diagnosis not present

## 2018-01-02 DIAGNOSIS — I10 Essential (primary) hypertension: Secondary | ICD-10-CM | POA: Diagnosis not present

## 2018-01-02 DIAGNOSIS — W19XXXA Unspecified fall, initial encounter: Secondary | ICD-10-CM | POA: Diagnosis not present

## 2018-01-02 DIAGNOSIS — G9001 Carotid sinus syncope: Secondary | ICD-10-CM | POA: Diagnosis not present

## 2018-01-02 DIAGNOSIS — Z7982 Long term (current) use of aspirin: Secondary | ICD-10-CM | POA: Diagnosis not present

## 2018-01-02 DIAGNOSIS — R55 Syncope and collapse: Secondary | ICD-10-CM | POA: Diagnosis not present

## 2018-01-02 DIAGNOSIS — E119 Type 2 diabetes mellitus without complications: Secondary | ICD-10-CM | POA: Diagnosis not present

## 2018-01-02 DIAGNOSIS — R011 Cardiac murmur, unspecified: Secondary | ICD-10-CM | POA: Diagnosis not present

## 2018-01-02 DIAGNOSIS — M6281 Muscle weakness (generalized): Secondary | ICD-10-CM | POA: Diagnosis not present

## 2018-01-02 DIAGNOSIS — F418 Other specified anxiety disorders: Secondary | ICD-10-CM | POA: Diagnosis not present

## 2018-01-02 LAB — GLUCOSE, CAPILLARY
Glucose-Capillary: 108 mg/dL — ABNORMAL HIGH (ref 65–99)
Glucose-Capillary: 111 mg/dL — ABNORMAL HIGH (ref 65–99)
Glucose-Capillary: 137 mg/dL — ABNORMAL HIGH (ref 65–99)

## 2018-01-02 MED ORDER — DOCUSATE SODIUM 100 MG PO CAPS
100.0000 mg | ORAL_CAPSULE | Freq: Two times a day (BID) | ORAL | Status: DC
  Administered 2018-01-02: 100 mg via ORAL
  Filled 2018-01-02: qty 1

## 2018-01-02 MED ORDER — CEPHALEXIN 500 MG PO CAPS
500.0000 mg | ORAL_CAPSULE | Freq: Two times a day (BID) | ORAL | Status: DC
  Administered 2018-01-02: 500 mg via ORAL
  Filled 2018-01-02: qty 1

## 2018-01-02 NOTE — Clinical Social Work Placement (Signed)
Nurse to call report to [phone], Room 1204    CLINICAL SOCIAL WORK PLACEMENT  NOTE  Date:  01/02/2018  Patient Details  Name: Kari Colon MRN: [id number] Date of Birth: 1935-01-23  Clinical Social Work is seeking post-discharge placement for this patient at the [place] level of care (*CSW will initial, date and re-position this form in  chart as items are completed):  Yes   Patient/family provided with [facility] Work Department's list of facilities offering this level of care within the geographic area requested by the patient (or if unable, by the patient's family).  Yes   Patient/family informed of their freedom to choose among providers that offer the needed level of care, that participate in Medicare, Medicaid or managed care program needed by the patient, have an available bed and are willing to accept the patient.  Yes   Patient/family informed of [facility]'s ownership interest in [facility] and [facility], as well as of the fact that they are under no obligation to receive care at these facilities.  PASRR submitted to EDS on       PASRR number received on       Existing PASRR number confirmed on 12/31/17     FL2 transmitted to all facilities in geographic area requested by pt/family on 12/31/17     FL2 transmitted to all facilities within larger geographic area on       Patient informed that his/her managed care company has contracts with or will negotiate with certain facilities, including the following:        Yes   Patient/family informed of bed offers received.  Patient chooses bed at [facility]     Physician recommends and patient chooses bed at      Patient to be transferred to [facility] on 01/02/18.  Patient to be transferred to facility by [facility]     Patient family notified on 01/02/18 of transfer.  Name of family member notified:  Estill Bamberg     PHYSICIAN       Additional Comment:     _______________________________________________ Geralynn Ochs, LCSW 01/02/2018, 5:22 PM

## 2018-01-02 NOTE — Progress Notes (Signed)
Patient left via [facility] to [facility]. VSS. No c/o pain. Reported off to [facility]. Belongings sent with patient.

## 2018-01-02 NOTE — Progress Notes (Signed)
Called [facility] to give report and I was transferred to another nurses station, then hung up on. I have called 14 times since then with no answer

## 2018-01-19 DIAGNOSIS — D6851 Activated protein C resistance: Secondary | ICD-10-CM | POA: Diagnosis not present

## 2018-01-19 DIAGNOSIS — I10 Essential (primary) hypertension: Secondary | ICD-10-CM | POA: Diagnosis not present

## 2018-01-19 DIAGNOSIS — F418 Other specified anxiety disorders: Secondary | ICD-10-CM | POA: Diagnosis not present

## 2018-01-19 DIAGNOSIS — I773 Arterial fibromuscular dysplasia: Secondary | ICD-10-CM | POA: Diagnosis not present

## 2018-01-19 DIAGNOSIS — D510 Vitamin B12 deficiency anemia due to intrinsic factor deficiency: Secondary | ICD-10-CM | POA: Diagnosis not present

## 2018-01-19 DIAGNOSIS — H3530 Unspecified macular degeneration: Secondary | ICD-10-CM | POA: Diagnosis not present

## 2018-01-22 DIAGNOSIS — E1140 Type 2 diabetes mellitus with diabetic neuropathy, unspecified: Secondary | ICD-10-CM | POA: Diagnosis not present

## 2018-01-22 DIAGNOSIS — N390 Urinary tract infection, site not specified: Secondary | ICD-10-CM | POA: Diagnosis not present

## 2018-01-22 DIAGNOSIS — K559 Vascular disorder of intestine, unspecified: Secondary | ICD-10-CM | POA: Diagnosis not present

## 2018-01-22 DIAGNOSIS — R1032 Left lower quadrant pain: Secondary | ICD-10-CM | POA: Diagnosis not present

## 2018-01-22 DIAGNOSIS — I701 Atherosclerosis of renal artery: Secondary | ICD-10-CM | POA: Diagnosis not present

## 2018-02-03 ENCOUNTER — Emergency Department (HOSPITAL_COMMUNITY): Payer: Medicare Other

## 2018-02-03 ENCOUNTER — Emergency Department (HOSPITAL_COMMUNITY)
Admission: EM | Admit: 2018-02-03 | Discharge: 2018-02-03 | Disposition: A | Discharge status: Home / Self Care | Payer: Medicare Other | Attending: Emergency Medicine | Admitting: Emergency Medicine

## 2018-02-03 ENCOUNTER — Encounter (HOSPITAL_COMMUNITY): Payer: Self-pay | Admitting: Emergency Medicine

## 2018-02-03 DIAGNOSIS — E119 Type 2 diabetes mellitus without complications: Secondary | ICD-10-CM | POA: Diagnosis not present

## 2018-02-03 DIAGNOSIS — Y999 Unspecified external cause status: Secondary | ICD-10-CM | POA: Insufficient documentation

## 2018-02-03 DIAGNOSIS — R627 Adult failure to thrive: Secondary | ICD-10-CM

## 2018-02-03 DIAGNOSIS — S0990XA Unspecified injury of head, initial encounter: Secondary | ICD-10-CM | POA: Diagnosis not present

## 2018-02-03 DIAGNOSIS — X58XXXA Exposure to other specified factors, initial encounter: Secondary | ICD-10-CM | POA: Insufficient documentation

## 2018-02-03 DIAGNOSIS — S064X1A Epidural hemorrhage with loss of consciousness of 30 minutes or less, initial encounter: Secondary | ICD-10-CM | POA: Diagnosis not present

## 2018-02-03 DIAGNOSIS — F17210 Nicotine dependence, cigarettes, uncomplicated: Secondary | ICD-10-CM | POA: Insufficient documentation

## 2018-02-03 DIAGNOSIS — Y939 Activity, unspecified: Secondary | ICD-10-CM | POA: Diagnosis not present

## 2018-02-03 DIAGNOSIS — Z7982 Long term (current) use of aspirin: Secondary | ICD-10-CM | POA: Insufficient documentation

## 2018-02-03 DIAGNOSIS — Y929 Unspecified place or not applicable: Secondary | ICD-10-CM | POA: Insufficient documentation

## 2018-02-03 DIAGNOSIS — Z79899 Other long term (current) drug therapy: Secondary | ICD-10-CM | POA: Diagnosis not present

## 2018-02-03 DIAGNOSIS — I10 Essential (primary) hypertension: Secondary | ICD-10-CM | POA: Insufficient documentation

## 2018-02-03 DIAGNOSIS — S199XXA Unspecified injury of neck, initial encounter: Secondary | ICD-10-CM | POA: Diagnosis not present

## 2018-02-03 DIAGNOSIS — D649 Anemia, unspecified: Secondary | ICD-10-CM | POA: Insufficient documentation

## 2018-02-03 DIAGNOSIS — S299XXA Unspecified injury of thorax, initial encounter: Secondary | ICD-10-CM | POA: Diagnosis not present

## 2018-02-03 DIAGNOSIS — R296 Repeated falls: Secondary | ICD-10-CM

## 2018-02-03 DIAGNOSIS — R2681 Unsteadiness on feet: Secondary | ICD-10-CM | POA: Insufficient documentation

## 2018-02-03 DIAGNOSIS — S0003XA Contusion of scalp, initial encounter: Secondary | ICD-10-CM | POA: Diagnosis not present

## 2018-02-03 DIAGNOSIS — I959 Hypotension, unspecified: Secondary | ICD-10-CM | POA: Diagnosis not present

## 2018-02-03 LAB — BASIC METABOLIC PANEL
ANION GAP: 12 (ref 5–15)
BUN: 10 mg/dL (ref 8–23)
CALCIUM: 9.2 mg/dL (ref 8.9–10.3)
CHLORIDE: 97 mmol/L — AB (ref 98–111)
CO2: 24 mmol/L (ref 22–32)
Creatinine, Ser: 0.93 mg/dL (ref 0.44–1.00)
GFR calc Af Amer: >60 mL/min (ref >60)
GFR calc non Af Amer: 56 mL/min — ABNORMAL LOW (ref >60)
GLUCOSE: 105 mg/dL — AB (ref 70–99)
POTASSIUM: 4.3 mmol/L (ref 3.5–5.1)
Sodium: 133 mmol/L — ABNORMAL LOW (ref 135–145)

## 2018-02-03 LAB — URINALYSIS, ROUTINE W REFLEX MICROSCOPIC
Bilirubin Urine: NEGATIVE
GLUCOSE, UA: NEGATIVE mg/dL
Hgb urine dipstick: NEGATIVE
Ketones, ur: NEGATIVE mg/dL
Leukocytes, UA: NEGATIVE
Nitrite: NEGATIVE
PH: 5 (ref 5.0–8.0)
Protein, ur: NEGATIVE mg/dL
SPECIFIC GRAVITY, URINE: 1.015 (ref 1.005–1.030)

## 2018-02-03 LAB — CBG MONITORING, ED: Glucose-Capillary: 87 mg/dL (ref 70–99)

## 2018-02-03 LAB — CBC
HEMATOCRIT: 29.1 % — AB (ref 36.0–46.0)
HEMOGLOBIN: 8.4 g/dL — AB (ref 12.0–15.0)
MCH: 22.6 pg — AB (ref 26.0–34.0)
MCHC: 28.9 g/dL — ABNORMAL LOW (ref 30.0–36.0)
MCV: 78.2 fL (ref 78.0–100.0)
Platelets: 351 10*3/uL (ref 150–400)
RBC: 3.72 MIL/uL — AB (ref 3.87–5.11)
RDW: 16.1 % — ABNORMAL HIGH (ref 11.5–15.5)
WBC: 9.9 10*3/uL (ref 4.0–10.5)

## 2018-02-03 MED ORDER — SODIUM CHLORIDE 0.9 % IV BOLUS
500.0000 mL | Freq: Once | INTRAVENOUS | Status: AC
  Administered 2018-02-03: 500 mL via INTRAVENOUS

## 2018-02-03 NOTE — ED Triage Notes (Signed)
Pt lives at home by herself. Pt's HH aid came inside and found that pt had Reportedly slid out of chair at home. Aid helped pt back into chair. EMS was called. Pt has two hematomas on the posterior side of her head. Pt alert for EMS. Pt confused- some confusion is normal at baseline, but EMS reports that pt's dtr says she seems more confused. Unsure of LOC. Denies feeling dizzy, denies CP. Pt's dtr reported to EMS that pt fell on Saturday and was on the ground for 9 hours. Pt takes plavix. Pt ambulatory for EMS, moves extremities well.

## 2018-02-03 NOTE — ED Notes (Signed)
Gave pt [place] sandwich, crackers and sprite. Pt's dtr at bedside

## 2018-02-03 NOTE — ED Notes (Signed)
Pt walking with Physical therapist

## 2018-02-03 NOTE — ED Notes (Signed)
This RN called pt's dtr, Estill Bamberg and informed her that they left their book "Let me Lie" and pt's yellow necklace with red jewels in the shape of a cross. Pt's dtr asked that [facility] be [facility] at [facility] and she will pick it up tomorrow

## 2018-02-03 NOTE — Progress Notes (Signed)
CSW spoke with pt and daughter at bedside. At this time daughter expressed that they are still waiting for PT to see pt. CSW was informed that family is willing to pay out of pocket in order to go to a facility today. CSW was informed that pt's choices are [facility] and [facility]. CSW reached out to facilities while at bedside with pt and was informed that pt would need to pay $8,050.00 up front for pt to come. Daughter expressed that pt is unable to do this. CSW spoke with Claiborne Billings from [facility] and was informed that if pt comes into the facility paying out of pocket then insurance would not pay for it as [facility] prefers for pt's to stay at the hospital til authorization has been given. CSW advised Claiborne Billings and pt that pt is not able to do this if ready for discharge.   CSW was asked by daughter for pt to stay in the ED overnight until she can get 24 hour care arranged at pt's home. CSW expressed usually MD's do not allow this but CSW would ask to be sure. CSW spoke with MD attending to pt and was informed that he would reach out to [facility] to see if this was possible. CSW did advise pt and daughter that if pt is unable to stay in the ED overnight then they would most likely be sent home. Daughter expressed understanding of this at this time.    Virgie Dad Stanton Kissoon, MSW, [place] [facility] Clinical Social Worker [phone]

## 2018-02-03 NOTE — ED Notes (Signed)
Pt's CBG result was 87. [facility] - RN.

## 2018-02-03 NOTE — Progress Notes (Signed)
HPI: Kari Colon is a 83 y.o. female with medical history significant of HTN; HLD; factor V leiden deficiency; and DM presenting with falls and failure to thrive as an outpatient.  ER evaluation has been unremarkable and patient is appropriate for d/c.  The patient's daughter requested placement and reported willingness to pay out of pocket, but it will be cost-prohibitive.  The daughter is concerned that the patient is unsafe at home and falling and requests observation while she sets things up at home.      Past Medical History:  Diagnosis Date  . Aftercare for healing traumatic fracture of hip   . Carotid bruit   . Chronic rhinitis   . Depressive disorder, not elsewhere classified   . Diabetes mellitus without complication ([place])   . Diverticulitis   . Duodenal ulcer   . Factor V deficiency ([place])   . Fibrocystic breast disease   . Fibromuscular dysplasia ([place])   . Generalized pain   . GERD (gastroesophageal reflux disease)   . Heart murmur   . Hyperlipidemia   . 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 Insomnia, unspecified   . Macular degeneration   . Mesenteric ischemia ([place])   . Other abnormal glucose   . Other specified rehabilitation procedure(V57.89)   . Personal history of fall   . Pulmonary HTN ([place])   . Reflux esophagitis   . Renal artery stenosis ([place])   . Subclavian arterial stenosis ([place])   . Tricuspid regurgitation   . Tubular adenoma of colon   . Unspecified constipation   . Unspecified essential hypertension   . Vitamin D deficiency     Past Surgical History:  Procedure Laterality Date  . AORTA - SUPERIOR MESENTERIC AND AORTA - RENAL ARTERY BYPASS GRAFT    . APPENDECTOMY    . CHOLECYSTECTOMY    . DILATION AND CURETTAGE OF UTERUS    . HIP SURGERY    . RENAL ARTERY BYPASS    . TONSILLECTOMY      Social History   Socioeconomic History  . Marital status: Unknown    Spouse name: Not on file  . Number of children: Not on file  . Years  of education: Not on file  . Highest education level: Not on file  Occupational History  . Not on file  Social Needs  . Financial resource strain: Not on file  . Food insecurity:    Worry: Not on file    Inability: Not on file  . Transportation needs:    Medical: Not on file    Non-medical: Not on file  Tobacco Use  . Smoking status: Current Every Day Smoker    Packs/day: 1.00    Types: Cigarettes  . Smokeless tobacco: Former Network engineer and Sexual Activity  . Alcohol use: No  . Drug use: No  . Sexual activity: Not on file  Lifestyle  . Physical activity:    Days per week: Not on file    Minutes per session: Not on file  . Stress: Not on file  Relationships  . Social connections:    Talks on phone: Not on file    Gets together: Not on file    Attends religious service: Not on file    Active member of club or organization: Not on file    Attends meetings of clubs or organizations: Not on file    Relationship status: Not on file  . Intimate partner violence:    Fear of current or  ex partner: Not on file    Emotionally abused: Not on file    Physically abused: Not on file    Forced sexual activity: Not on file  Other Topics Concern  . Not on file  Social History Narrative  . Not on file    Allergies  Allergen Reactions  . Mirapex [Pramipexole Dihydrochloride] Other (See Comments)    insomnia  . Nexium [Esomeprazole Magnesium] Other (See Comments)    Ache in chest  . Prevacid [Lansoprazole] Other (See Comments)    Ache in chest  . Ticlid [Ticlopidine] Hives  . Wellbutrin [Bupropion] Hives and Other (See Comments)    lightheadedness  . Zantac [Ranitidine Hcl] Hives  . Amoxil [Amoxicillin] Rash  . Effexor [Venlafaxine] Rash  . Penicillins Rash    Has patient had a PCN reaction causing immediate rash, facial/tongue/throat swelling, SOB or lightheadedness with hypotension: Yes Has patient had a PCN reaction causing severe rash involving mucus membranes or skin  necrosis: No Has patient had a PCN reaction that required hospitalization: No Has patient had a PCN reaction occurring within the last 10 years: No If all of the above answers are "NO", then may proceed with Cephalosporin use.    History reviewed. No pertinent family history.  Prior to Admission medications   Medication Sig Start Date End Date Taking? Authorizing Provider  acetaminophen (TYLENOL) 650 MG CR tablet Take 1,300 mg by mouth every 8 (eight) hours as needed for pain.   Yes [provider]  ALPRAZolam Duanne Moron) 0.5 MG tablet Take 0.5 mg by mouth 3 (three) times daily as needed for anxiety. 10/12/17  Yes [provider]  aspirin EC 81 MG tablet Take 81 mg by mouth daily.   Yes [provider]  cetirizine (ZYRTEC) 10 MG tablet Take 10 mg by mouth daily.   Yes [provider]  Cholecalciferol (VITAMIN D) 2000 units tablet Take 2,000 Units by mouth daily.   Yes [provider]  clopidogrel (PLAVIX) 75 MG tablet Take 75 mg by mouth daily. 10/19/17  Yes [provider]  glycerin adult 2 g suppository Place 1 suppository rectally as needed for constipation.   Yes [provider]  isosorbide mononitrate (IMDUR) 30 MG 24 hr tablet Take 30 mg by mouth daily. 08/21/17  Yes [provider]  levofloxacin (LEVAQUIN) 500 MG tablet Take 500 mg by mouth daily. 01/27/18  Yes [provider]  losartan (COZAAR) 50 MG tablet Take 50 mg by mouth daily.   Yes [provider]  metFORMIN (GLUCOPHAGE) 500 MG tablet Take 1,000 mg by mouth 2 (two) times daily. 10/27/17  Yes [provider]  omeprazole (PRILOSEC OTC) 20 MG tablet Take 40 mg by mouth daily.   Yes [provider]  PARoxetine (PAXIL) 30 MG tablet Take 60 mg by mouth daily.  10/14/17  Yes [provider]  polyethylene glycol (MIRALAX / GLYCOLAX) packet Take 17 g by mouth daily as needed for mild constipation. 01/01/18  Yes Vann, Jessica U, DO   RABEprazole (ACIPHEX) 20 MG tablet Take 20 mg by mouth daily.   Yes [provider]  traMADol (ULTRAM) 50 MG tablet Take 2 tablets (100 mg total) by mouth every 8 (eight) hours as needed (pain). 01/01/18  Yes Vann, Jessica U, DO  cephALEXin (KEFLEX) 500 MG capsule Take 1 capsule (500 mg total) by mouth every 12 (twelve) hours. Patient not taking: Reported on 02/03/2018 01/01/18   Geradine Girt, DO  docusate sodium (COLACE) 100 MG  capsule Take 1 capsule (100 mg total) by mouth 2 (two) times daily. Patient not taking: Reported on 02/03/2018 01/01/18   Geradine Girt, DO    Physical Exam: Vitals:   02/03/18 1330 02/03/18 1345 02/03/18 1415 02/03/18 1445  BP: (!) 146/89 (!) 153/80 (!) 133/96 (!) 160/51  Pulse: 70 67 80 73  Resp: 16 16 14 16   Temp:      TempSrc:      SpO2: 99% 100% (!) 79% 97%     Radiological Exams on Admission: Dg Chest 2 View  Result Date: 02/03/2018 CLINICAL DATA:  Syncopal episode with subsequent fall striking the back of the head today. Current smoker. EXAM: CHEST - 2 VIEW COMPARISON:  PA and lateral chest x-ray of Dec 29, 2017 FINDINGS: There is mild chronic elevation of the left hemidiaphragm. The lungs are well-expanded with hemidiaphragm flattening. The heart and pulmonary vascularity are normal. The mediastinum is normal in width. There is calcification in the wall of the aortic arch and descending thoracic aorta. The observed bony thorax exhibits no acute abnormality. IMPRESSION: Chronic bronchitic changes, stable.  No pneumonia nor CHF. Thoracic aortic atherosclerosis. Electronically Signed   By: David  Martinique M.D.   On: 02/03/2018 11:12   Ct Head Wo Contrast  Result Date: 02/03/2018 CLINICAL DATA:  83 year old female slid out of chair. Hematoma posterior aspect of head. Increased confusion. Unsure of loss of consciousness. Initial encounter. EXAM: CT HEAD WITHOUT CONTRAST CT CERVICAL SPINE WITHOUT CONTRAST TECHNIQUE: Multidetector CT imaging of the head and  cervical spine was performed following the standard protocol without intravenous contrast. Multiplanar CT image reconstructions of the cervical spine were also generated. COMPARISON:  12/29/2017 head CT.  12/27/2017 thoracic spine MR. FINDINGS: CT HEAD FINDINGS Brain: No intracranial hemorrhage or CT evidence of large acute infarct. Prominent chronic microvascular changes. Moderate global atrophy. No intracranial mass lesion noted on this unenhanced exam. Vascular: Vascular calcifications Skull: No skull fracture Sinuses/Orbits: No acute orbital abnormality. Visualized paranasal sinuses clear. Mastoid air cells and middle ear cavities clear. Other: Right parietal scalp hematoma. CT CERVICAL SPINE FINDINGS Alignment: Normal alignment cervical spine. Skull base and vertebrae: No cervical spine fracture. Remote T3 compression fracture with 90% loss of height centrally and slight retropulsion posterior inferior aspect with mild kyphosis centered at this level. Soft tissues and spinal canal: No abnormal prevertebral soft tissue swelling. Disc levels:  Cervical spondylotic changes most notable C4-5. Upper chest: Bilateral apical pleural thickening. Other: Bilateral carotid bifurcation calcifications. IMPRESSION: CT HEAD: Right parietal scalp hematoma. No skull fracture or intracranial hemorrhage. Prominent chronic microvascular changes. Moderate global atrophy. CT CERVICAL SPINE: Normal alignment cervical spine. No cervical spine fracture or abnormal prevertebral soft tissue swelling. Remote T3 compression fracture with 90% loss of height centrally and slight retropulsion posterior inferior aspect with mild kyphosis centered at this level. Electronically Signed   By: Genia Del M.D.   On: 02/03/2018 12:04   Ct Cervical Spine Wo Contrast  Result Date: 02/03/2018 CLINICAL DATA:  83 year old female slid out of chair. Hematoma posterior aspect of head. Increased confusion. Unsure of loss of consciousness. Initial  encounter. EXAM: CT HEAD WITHOUT CONTRAST CT CERVICAL SPINE WITHOUT CONTRAST TECHNIQUE: Multidetector CT imaging of the head and cervical spine was performed following the standard protocol without intravenous contrast. Multiplanar CT image reconstructions of the cervical spine were also generated. COMPARISON:  12/29/2017 head CT.  12/27/2017 thoracic spine MR. FINDINGS: CT HEAD FINDINGS Brain: No intracranial hemorrhage or CT evidence of large acute infarct.  Prominent chronic microvascular changes. Moderate global atrophy. No intracranial mass lesion noted on this unenhanced exam. Vascular: Vascular calcifications Skull: No skull fracture Sinuses/Orbits: No acute orbital abnormality. Visualized paranasal sinuses clear. Mastoid air cells and middle ear cavities clear. Other: Right parietal scalp hematoma. CT CERVICAL SPINE FINDINGS Alignment: Normal alignment cervical spine. Skull base and vertebrae: No cervical spine fracture. Remote T3 compression fracture with 90% loss of height centrally and slight retropulsion posterior inferior aspect with mild kyphosis centered at this level. Soft tissues and spinal canal: No abnormal prevertebral soft tissue swelling. Disc levels:  Cervical spondylotic changes most notable C4-5. Upper chest: Bilateral apical pleural thickening. Other: Bilateral carotid bifurcation calcifications. IMPRESSION: CT HEAD: Right parietal scalp hematoma. No skull fracture or intracranial hemorrhage. Prominent chronic microvascular changes. Moderate global atrophy. CT CERVICAL SPINE: Normal alignment cervical spine. No cervical spine fracture or abnormal prevertebral soft tissue swelling. Remote T3 compression fracture with 90% loss of height centrally and slight retropulsion posterior inferior aspect with mild kyphosis centered at this level. Electronically Signed   By: Genia Del M.D.   On: 02/03/2018 12:04   Assessment/Plan Active Problems:   * No active hospital problems. *    Adult  failure to thrive - unfortunately, we cannot admit patients to the hospital without a medical indication to do so.  This has been explained to the family and we are unable to even observe the patient in the hospital without a compelling indication to do so.  Appreciate SW/CM resources utilized in this situation.  Karmen Bongo MD [facility]  If note is complete, please contact covering daytime or nighttime physician. [email or web address] Password [email or web address]  02/03/2018, 3:09 PM

## 2018-02-03 NOTE — Evaluation (Signed)
Physical Therapy Evaluation Patient Details Name: Kari Colon MRN: [id number] DOB: Jun 22, 1935 Today's Date: 02/03/2018   History of Present Illness  Pt brought to the ED after fall at home. PMH - hip fx, GERD, hypertension, history of factor V [place], mesenteric ischemia, macular degeneration, depression, and insomnia.   Clinical Impression  Pt presents to PT with impaired mobility and continued falls at home. Recommend SNF for further rehab to reduce falls and decr burden of care. Pt's daughter is arranging additional care at home to assist patient until she can be approved to go to SNF.     Follow Up Recommendations SNF    Equipment Recommendations  None recommended by PT    Recommendations for Other Services       Precautions / Restrictions Precautions Precautions: Fall Restrictions Weight Bearing Restrictions: No      Mobility  Bed Mobility Overal bed mobility: Needs Assistance Bed Mobility: Supine to Sit;Sit to Supine     Supine to sit: Min assist Sit to supine: Min assist   General bed mobility comments: Assist to elevate trunk into sitting and bring hips to edge. Assist to bring legs back up into bed  Transfers Overall transfer level: Needs assistance Equipment used: 4-wheeled walker Transfers: Sit to/from Stand Sit to Stand: Min assist         General transfer comment: Assist to bring hips up and for balance.  Ambulation/Gait Ambulation/Gait assistance: Min assist Gait Distance (Feet): 90 Feet Assistive device: 4-wheeled walker Gait Pattern/deviations: Step-through pattern;Decreased step length - right;Decreased step length - left;Shuffle;Trunk flexed Gait velocity: decr Gait velocity interpretation: <1.8 ft/sec, indicate of risk for recurrent falls General Gait Details: Assist for balance and support. Verbal cues to stay closer to walker. Verbal cues to unlock brakes prior to beginning to amb  Stairs            Wheelchair Mobility     Modified Rankin (Stroke Patients Only)       Balance Overall balance assessment: Needs assistance Sitting-balance support: No upper extremity supported;Feet supported Sitting balance-Leahy Scale: Fair     Standing balance support: Single extremity supported;During functional activity Standing balance-Leahy Scale: Poor Standing balance comment: UE support and min guard for static standing. Min assist for any type of dynamic movement                             Pertinent Vitals/Pain Pain Assessment: Faces Faces Pain Scale: Hurts a little bit Pain Location: low back Pain Descriptors / Indicators: Discomfort Pain Intervention(s): Limited activity within patient's tolerance;Monitored during session    [place] expects to be discharged to:: Private residence Living Arrangements: Alone Available Help at Discharge: Family;Personal care attendant;Available PRN/intermittently Type of Home: Apartment Home Access: Level entry     Home Layout: One level Home Equipment: Cane - single point;Walker - 4 wheels Additional Comments: Pt has not had caregivers over night.    Prior Function Level of Independence: Needs assistance   Gait / Transfers Assistance Needed: amb with rollator. Recent falls           Hand Dominance   Dominant Hand: Right    Extremity/Trunk Assessment   Upper Extremity Assessment Upper Extremity Assessment: Generalized weakness    Lower Extremity Assessment Lower Extremity Assessment: Generalized weakness       Communication   Communication: No difficulties  Cognition Arousal/Alertness: Awake/alert Behavior During Therapy: WFL for tasks assessed/performed Overall Cognitive Status: History of cognitive impairments -  at baseline Area of Impairment: Memory                     Memory: Decreased short-term memory;Decreased recall of precautions                General Comments      Exercises      Assessment/Plan    PT Assessment All further PT needs can be met in the next venue of care  PT Problem List Decreased strength;Decreased activity tolerance;Decreased balance;Decreased mobility;Decreased cognition;Decreased knowledge of use of DME;Decreased safety awareness       PT Treatment Interventions      PT Goals (Current goals can be found in the Care Plan section)  Acute Rehab PT Goals PT Goal Formulation: All assessment and education complete, DC therapy    Frequency     Barriers to discharge        Co-evaluation               AM-PAC PT "6 Clicks" Daily Activity  Outcome Measure Difficulty turning over in bed (including adjusting bedclothes, sheets and blankets)?: Unable Difficulty moving from lying on back to sitting on the side of the bed? : Unable Difficulty sitting down on and standing up from a chair with arms (e.g., wheelchair, bedside commode, etc,.)?: Unable Help needed moving to and from a bed to chair (including a wheelchair)?: A Little Help needed walking in hospital room?: A Little Help needed climbing 3-5 steps with a railing? : A Lot 6 Click Score: 11    End of Session   Activity Tolerance: Patient limited by fatigue Patient left: in bed;with call bell/phone within reach;with family/visitor present;with nursing/sitter in room Nurse Communication: Mobility status PT Visit Diagnosis: Unsteadiness on feet (R26.81);Other abnormalities of gait and mobility (R26.89);Muscle weakness (generalized) (M62.81);Repeated falls (R29.6)    Time: 5498-2641 PT Time Calculation (min) (ACUTE ONLY): 20 min   Charges:   PT Evaluation $PT Eval Low Complexity: 1 Low     PT G CodesMarland Kitchen        [facility] PT [place] 02/03/2018, 3:45 PM

## 2018-02-03 NOTE — ED Notes (Signed)
This RN spoke with PT coordinator to ensure pt is seen for evaluation- this is for her d/c

## 2018-02-03 NOTE — Discharge Planning (Signed)
[facility] consulted to assist with safe disposition for pt.  EDCM spoke with pt daughter, Kari Colon, who confirms pt lives at home alone; has [facility] services and PCS with [facility] and still is not safe at home.    [facility] consulted EDSW and PT to evaluate for disposition needs.

## 2018-02-03 NOTE — ED Provider Notes (Signed)
[facility] EMERGENCY DEPARTMENT Provider Note   CSN: [id number] Arrival date & time: 02/03/18  0906     History   Chief Complaint Chief Complaint  Patient presents with  . Fall    HPI Kari Colon is a 83 y.o. female.  Patient with history of depression, diverticulitis, reflux, lipids, factor V deficiency presents with worsening deconditioning/failure to thrive and frequent falls for the past few months.  Patient was in rehab and now is living in apartment with home care however she is still getting up and falling.  Patient supposed to use a walker.  This episode patient was found on the ground unsure what happened with evidence of head injury in the back of her head.  Patient is on aspirin.  Patient's daughter helps to take care of her and check on her as well.     Past Medical History:  Diagnosis Date  . Aftercare for healing traumatic fracture of hip   . Carotid bruit   . Chronic rhinitis   . Depressive disorder, not elsewhere classified   . Diabetes mellitus without complication ([place])   . Diverticulitis   . Duodenal ulcer   . Factor V deficiency ([place])   . Fibrocystic breast disease   . Fibromuscular dysplasia ([place])   . Generalized pain   . GERD (gastroesophageal reflux disease)   . Heart murmur   . Hyperlipidemia   . 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 Insomnia, unspecified   . Macular degeneration   . Mesenteric ischemia ([place])   . Other abnormal glucose   . Other specified rehabilitation procedure(V57.89)   . Personal history of fall   . Pulmonary HTN ([place])   . Reflux esophagitis   . Renal artery stenosis ([place])   . Subclavian arterial stenosis ([place])   . Tricuspid regurgitation   . Tubular adenoma of colon   . Unspecified constipation   . Unspecified essential hypertension   . Vitamin D deficiency     Patient Active Problem List   Diagnosis Date Noted  . Syncope 12/29/2017  . Carotid bruit 12/29/2017  . Depression  with anxiety 12/29/2017  . Factor V deficiency ([place]) 12/29/2017  . GERD (gastroesophageal reflux disease) 12/29/2017  . Heart murmur 12/29/2017    Past Surgical History:  Procedure Laterality Date  . AORTA - SUPERIOR MESENTERIC AND AORTA - RENAL ARTERY BYPASS GRAFT    . APPENDECTOMY    . CHOLECYSTECTOMY    . DILATION AND CURETTAGE OF UTERUS    . HIP SURGERY    . RENAL ARTERY BYPASS    . TONSILLECTOMY       OB History   None      Home Medications    Prior to Admission medications   Medication Sig Start Date End Date Taking? Authorizing Provider  acetaminophen (TYLENOL) 650 MG CR tablet Take 1,300 mg by mouth every 8 (eight) hours as needed for pain.   Yes [provider]  ALPRAZolam Duanne Moron) 0.5 MG tablet Take 0.5 mg by mouth 3 (three) times daily as needed for anxiety. 10/12/17  Yes [provider]  aspirin EC 81 MG tablet Take 81 mg by mouth daily.   Yes [provider]  cetirizine (ZYRTEC) 10 MG tablet Take 10 mg by mouth daily.   Yes [provider]  Cholecalciferol (VITAMIN D) 2000 units tablet Take 2,000 Units by mouth daily.   Yes [provider]  clopidogrel (PLAVIX) 75 MG tablet Take 75 mg by mouth daily.  10/19/17  Yes [provider]  glycerin adult 2 g suppository Place 1 suppository rectally as needed for constipation.   Yes [provider]  isosorbide mononitrate (IMDUR) 30 MG 24 hr tablet Take 30 mg by mouth daily. 08/21/17  Yes [provider]  levofloxacin (LEVAQUIN) 500 MG tablet Take 500 mg by mouth daily. 01/27/18  Yes [provider]  losartan (COZAAR) 50 MG tablet Take 50 mg by mouth daily.   Yes [provider]  metFORMIN (GLUCOPHAGE) 500 MG tablet Take 1,000 mg by mouth 2 (two) times daily. 10/27/17  Yes [provider]  omeprazole (PRILOSEC OTC) 20 MG tablet Take 40 mg by mouth daily.   Yes [provider]  PARoxetine (PAXIL) 30 MG tablet Take 60 mg by  mouth daily.  10/14/17  Yes [provider]  polyethylene glycol (MIRALAX / GLYCOLAX) packet Take 17 g by mouth daily as needed for mild constipation. 01/01/18  Yes Vann, Jessica U, DO  RABEprazole (ACIPHEX) 20 MG tablet Take 20 mg by mouth daily.   Yes [provider]  traMADol (ULTRAM) 50 MG tablet Take 2 tablets (100 mg total) by mouth every 8 (eight) hours as needed (pain). 01/01/18  Yes Vann, Jessica U, DO  cephALEXin (KEFLEX) 500 MG capsule Take 1 capsule (500 mg total) by mouth every 12 (twelve) hours. Patient not taking: Reported on 02/03/2018 01/01/18   Geradine Girt, DO  docusate sodium (COLACE) 100 MG capsule Take 1 capsule (100 mg total) by mouth 2 (two) times daily. Patient not taking: Reported on 02/03/2018 01/01/18   Geradine Girt, DO    Family History History reviewed. No pertinent family history.  Social History Social History   Tobacco Use  . Smoking status: Current Every Day Smoker    Packs/day: 1.00    Types: Cigarettes  . Smokeless tobacco: Former Network engineer Use Topics  . Alcohol use: No  . Drug use: No     Allergies   Mirapex [pramipexole dihydrochloride]; Nexium [esomeprazole magnesium]; Prevacid [lansoprazole]; Ticlid [ticlopidine]; Wellbutrin [bupropion]; Zantac [ranitidine hcl]; Amoxil [amoxicillin]; Effexor [venlafaxine]; and Penicillins   Review of Systems Review of Systems  Unable to perform ROS: Mental status change     Physical Exam Updated Vital Signs BP (!) 160/51   Pulse 73   Temp 97.8 F (36.6 C) (Oral)   Resp 16   SpO2 97%   Physical Exam  Constitutional: She is oriented to person, place, and time. She appears well-developed and well-nourished.  HENT:  Head: Normocephalic.  Neck supple full rom, mild hematoma posterior scalp  Eyes: Conjunctivae are normal. Right eye exhibits no discharge. Left eye exhibits no discharge.  Neck: Normal range of motion. Neck supple. No tracheal deviation present.  Cardiovascular:  Normal rate and regular rhythm.  Pulmonary/Chest: Effort normal and breath sounds normal.  Abdominal: Soft. She exhibits no distension. There is no tenderness. There is no guarding.  Musculoskeletal: She exhibits no edema.  Neurological: She is alert and oriented to person, place, and time. GCS eye subscore is 4. GCS verbal subscore is 4. GCS motor subscore is 6.  Patient is generally weak on exam, equal strength bilateral extremities, pupils equal extraocular muscle function intact.  Patient does not recall events and confused to surroundings.  Skin: Skin is warm. No rash noted.  Psychiatric: She has a normal mood and affect.  Nursing note and vitals reviewed.    ED Treatments / Results  Labs (all labs ordered are listed, but  only abnormal results are displayed) Labs Reviewed  BASIC METABOLIC PANEL - Abnormal; Notable for the following components:      Result Value   Sodium 133 (*)    Chloride 97 (*)    Glucose, Bld 105 (*)    GFR calc non Af Amer 56 (*)    All other components within normal limits  CBC - Abnormal; Notable for the following components:   RBC 3.72 (*)    Hemoglobin 8.4 (*)    HCT 29.1 (*)    MCH 22.6 (*)    MCHC 28.9 (*)    RDW 16.1 (*)    All other components within normal limits  URINALYSIS, ROUTINE W REFLEX MICROSCOPIC  CBG MONITORING, ED    EKG None  Radiology Dg Chest 2 View  Result Date: 02/03/2018 CLINICAL DATA:  Syncopal episode with subsequent fall striking the back of the head today. Current smoker. EXAM: CHEST - 2 VIEW COMPARISON:  PA and lateral chest x-ray of Dec 29, 2017 FINDINGS: There is mild chronic elevation of the left hemidiaphragm. The lungs are well-expanded with hemidiaphragm flattening. The heart and pulmonary vascularity are normal. The mediastinum is normal in width. There is calcification in the wall of the aortic arch and descending thoracic aorta. The observed bony thorax exhibits no acute abnormality. IMPRESSION: Chronic  bronchitic changes, stable.  No pneumonia nor CHF. Thoracic aortic atherosclerosis. Electronically Signed   By: David  Martinique M.D.   On: 02/03/2018 11:12   Ct Head Wo Contrast  Result Date: 02/03/2018 CLINICAL DATA:  83 year old female slid out of chair. Hematoma posterior aspect of head. Increased confusion. Unsure of loss of consciousness. Initial encounter. EXAM: CT HEAD WITHOUT CONTRAST CT CERVICAL SPINE WITHOUT CONTRAST TECHNIQUE: Multidetector CT imaging of the head and cervical spine was performed following the standard protocol without intravenous contrast. Multiplanar CT image reconstructions of the cervical spine were also generated. COMPARISON:  12/29/2017 head CT.  12/27/2017 thoracic spine MR. FINDINGS: CT HEAD FINDINGS Brain: No intracranial hemorrhage or CT evidence of large acute infarct. Prominent chronic microvascular changes. Moderate global atrophy. No intracranial mass lesion noted on this unenhanced exam. Vascular: Vascular calcifications Skull: No skull fracture Sinuses/Orbits: No acute orbital abnormality. Visualized paranasal sinuses clear. Mastoid air cells and middle ear cavities clear. Other: Right parietal scalp hematoma. CT CERVICAL SPINE FINDINGS Alignment: Normal alignment cervical spine. Skull base and vertebrae: No cervical spine fracture. Remote T3 compression fracture with 90% loss of height centrally and slight retropulsion posterior inferior aspect with mild kyphosis centered at this level. Soft tissues and spinal canal: No abnormal prevertebral soft tissue swelling. Disc levels:  Cervical spondylotic changes most notable C4-5. Upper chest: Bilateral apical pleural thickening. Other: Bilateral carotid bifurcation calcifications. IMPRESSION: CT HEAD: Right parietal scalp hematoma. No skull fracture or intracranial hemorrhage. Prominent chronic microvascular changes. Moderate global atrophy. CT CERVICAL SPINE: Normal alignment cervical spine. No cervical spine fracture or  abnormal prevertebral soft tissue swelling. Remote T3 compression fracture with 90% loss of height centrally and slight retropulsion posterior inferior aspect with mild kyphosis centered at this level. Electronically Signed   By: Genia Del M.D.   On: 02/03/2018 12:04   Ct Cervical Spine Wo Contrast  Result Date: 02/03/2018 CLINICAL DATA:  83 year old female slid out of chair. Hematoma posterior aspect of head. Increased confusion. Unsure of loss of consciousness. Initial encounter. EXAM: CT HEAD WITHOUT CONTRAST CT CERVICAL SPINE WITHOUT CONTRAST TECHNIQUE: Multidetector CT imaging of the head and cervical spine was performed following the  standard protocol without intravenous contrast. Multiplanar CT image reconstructions of the cervical spine were also generated. COMPARISON:  12/29/2017 head CT.  12/27/2017 thoracic spine MR. FINDINGS: CT HEAD FINDINGS Brain: No intracranial hemorrhage or CT evidence of large acute infarct. Prominent chronic microvascular changes. Moderate global atrophy. No intracranial mass lesion noted on this unenhanced exam. Vascular: Vascular calcifications Skull: No skull fracture Sinuses/Orbits: No acute orbital abnormality. Visualized paranasal sinuses clear. Mastoid air cells and middle ear cavities clear. Other: Right parietal scalp hematoma. CT CERVICAL SPINE FINDINGS Alignment: Normal alignment cervical spine. Skull base and vertebrae: No cervical spine fracture. Remote T3 compression fracture with 90% loss of height centrally and slight retropulsion posterior inferior aspect with mild kyphosis centered at this level. Soft tissues and spinal canal: No abnormal prevertebral soft tissue swelling. Disc levels:  Cervical spondylotic changes most notable C4-5. Upper chest: Bilateral apical pleural thickening. Other: Bilateral carotid bifurcation calcifications. IMPRESSION: CT HEAD: Right parietal scalp hematoma. No skull fracture or intracranial hemorrhage. Prominent chronic  microvascular changes. Moderate global atrophy. CT CERVICAL SPINE: Normal alignment cervical spine. No cervical spine fracture or abnormal prevertebral soft tissue swelling. Remote T3 compression fracture with 90% loss of height centrally and slight retropulsion posterior inferior aspect with mild kyphosis centered at this level. Electronically Signed   By: Genia Del M.D.   On: 02/03/2018 12:04    Procedures Procedures (including critical care time)  Medications Ordered in ED Medications  sodium chloride 0.9 % bolus 500 mL (0 mLs Intravenous Stopped 02/03/18 1152)     Initial Impression / Assessment and Plan / ED Course  I have reviewed the triage vital signs and the nursing notes.  Pertinent labs & imaging results that were available during my care of the patient were reviewed by me and considered in my medical decision making (see chart for details).    History of depression patient generally weak on examCT head neck with evidence of head injury hematoma on exam.  Patient has mild tenderness right ribs chest x-ray ordered.  Case management consult pending. Blood work reviewed showing chronic anemia.  Urinalysis no signs of significant infection patient currently on antibiotics. CT scans no acute bleeding no acute fracture.  Discussed with case manager plan for physical therapy consult and they are assisting with placement.  Case management  assessed the patient and tried to arrange a few different options however significant financial demand required.  Patient does not meet criteria for medical admission at this time.  Discussed this with tried hospitalist as well who agreed patient did not meet criteria.  Plan for outpatient follow-up for placement.  Final Clinical Impressions(s) / ED Diagnoses   Final diagnoses:  Adult failure to thrive  Frequent falls  Acute head injury, initial encounter  Anemia, unspecified type    ED Discharge Orders    None       Elnora Morrison,  MD 02/03/18 1516

## 2018-02-03 NOTE — Discharge Instructions (Addendum)
Follow up with primary doctor to arrange placement.

## 2018-02-05 DIAGNOSIS — R296 Repeated falls: Secondary | ICD-10-CM | POA: Diagnosis not present

## 2018-02-07 DIAGNOSIS — I10 Essential (primary) hypertension: Secondary | ICD-10-CM | POA: Diagnosis not present

## 2018-02-07 DIAGNOSIS — R41841 Cognitive communication deficit: Secondary | ICD-10-CM | POA: Diagnosis not present

## 2018-02-07 DIAGNOSIS — I773 Arterial fibromuscular dysplasia: Secondary | ICD-10-CM | POA: Diagnosis not present

## 2018-02-07 DIAGNOSIS — R29898 Other symptoms and signs involving the musculoskeletal system: Secondary | ICD-10-CM | POA: Diagnosis not present

## 2018-02-07 DIAGNOSIS — M6281 Muscle weakness (generalized): Secondary | ICD-10-CM | POA: Diagnosis not present

## 2018-02-07 DIAGNOSIS — G9001 Carotid sinus syncope: Secondary | ICD-10-CM | POA: Diagnosis not present

## 2018-02-07 DIAGNOSIS — R0989 Other specified symptoms and signs involving the circulatory and respiratory systems: Secondary | ICD-10-CM | POA: Diagnosis not present

## 2018-02-07 DIAGNOSIS — R278 Other lack of coordination: Secondary | ICD-10-CM | POA: Diagnosis not present

## 2018-02-07 DIAGNOSIS — Z9181 History of falling: Secondary | ICD-10-CM | POA: Diagnosis not present

## 2018-02-07 DIAGNOSIS — R2689 Other abnormalities of gait and mobility: Secondary | ICD-10-CM | POA: Diagnosis not present

## 2018-02-07 DIAGNOSIS — F17200 Nicotine dependence, unspecified, uncomplicated: Secondary | ICD-10-CM | POA: Diagnosis not present

## 2018-02-07 DIAGNOSIS — R296 Repeated falls: Secondary | ICD-10-CM | POA: Diagnosis not present

## 2018-02-09 DIAGNOSIS — F17200 Nicotine dependence, unspecified, uncomplicated: Secondary | ICD-10-CM | POA: Diagnosis not present

## 2018-02-10 DIAGNOSIS — I773 Arterial fibromuscular dysplasia: Secondary | ICD-10-CM | POA: Diagnosis not present

## 2018-02-10 DIAGNOSIS — R29898 Other symptoms and signs involving the musculoskeletal system: Secondary | ICD-10-CM | POA: Diagnosis not present

## 2018-02-10 DIAGNOSIS — I10 Essential (primary) hypertension: Secondary | ICD-10-CM | POA: Diagnosis not present

## 2018-02-10 DIAGNOSIS — R296 Repeated falls: Secondary | ICD-10-CM | POA: Diagnosis not present

## 2018-02-28 ENCOUNTER — Telehealth: Payer: Self-pay

## 2018-02-28 NOTE — Telephone Encounter (Signed)
Phone call placed to patient's daughter to schedule visit with Palliative care. Daughter shared that patient has 24/7 caregivers. Unable to schedule visit at this time as daughter will be going out of town. Daughter to call back when she has returned

## 2018-03-18 DIAGNOSIS — E1140 Type 2 diabetes mellitus with diabetic neuropathy, unspecified: Secondary | ICD-10-CM | POA: Diagnosis not present

## 2018-03-18 DIAGNOSIS — R10814 Left lower quadrant abdominal tenderness: Secondary | ICD-10-CM | POA: Diagnosis not present

## 2018-03-18 DIAGNOSIS — R296 Repeated falls: Secondary | ICD-10-CM | POA: Diagnosis not present

## 2018-03-18 DIAGNOSIS — Z0000 Encounter for general adult medical examination without abnormal findings: Secondary | ICD-10-CM | POA: Diagnosis not present

## 2018-03-26 ENCOUNTER — Telehealth: Payer: Self-pay

## 2018-03-26 NOTE — Telephone Encounter (Signed)
Phone call placed to patient's daughter to schedule visit with Palliative Care. Daughter to call back tomorrow to schedule

## 2018-03-31 ENCOUNTER — Telehealth: Payer: Self-pay

## 2018-03-31 NOTE — Telephone Encounter (Signed)
Received VM from patient's daughter who requested a return call to schedule visit. Return call placed to daughter. Visit scheduled for 04/03/18

## 2018-04-03 ENCOUNTER — Encounter: Payer: Self-pay | Admitting: Nurse Practitioner

## 2018-04-03 ENCOUNTER — Other Ambulatory Visit: Payer: Medicare Other | Admitting: Nurse Practitioner

## 2018-04-03 DIAGNOSIS — R6251 Failure to thrive (child): Secondary | ICD-10-CM

## 2018-04-03 DIAGNOSIS — R296 Repeated falls: Secondary | ICD-10-CM

## 2018-04-03 DIAGNOSIS — Z515 Encounter for palliative care: Secondary | ICD-10-CM

## 2018-04-03 DIAGNOSIS — Z9181 History of falling: Secondary | ICD-10-CM

## 2018-04-03 DIAGNOSIS — R634 Abnormal weight loss: Secondary | ICD-10-CM

## 2018-04-03 DIAGNOSIS — R269 Unspecified abnormalities of gait and mobility: Secondary | ICD-10-CM

## 2018-04-03 DIAGNOSIS — F0390 Unspecified dementia without behavioral disturbance: Secondary | ICD-10-CM

## 2018-04-03 DIAGNOSIS — G8929 Other chronic pain: Secondary | ICD-10-CM

## 2018-04-03 DIAGNOSIS — E119 Type 2 diabetes mellitus without complications: Secondary | ICD-10-CM

## 2018-04-03 NOTE — Progress Notes (Signed)
PALLIATIVE CARE CONSULT VISIT   PATIENT NAME: Kari Colon DOB: Aug 07, 1934 MRN: [id number]  PRIMARY CARE PROVIDER:   Josetta Huddle, MD  REFERRING PROVIDER:  Josetta Huddle, MD [address] [place], [place] [address]  RESPONSIBLE PARTY:   Gelene Mink [phone]   ASSESSMENT/RECOMMENDATIONS:  1.Primary MD- please consider decreasing Metformin to 500mg  BID decrease risk of hypoglycemia (A2c 5.7) 2. Check TSH due to rapid weight loss   -Dementia wo behavioral disturbance -deconditioning -gait disorder -frequent falls/high risk -osteoporosis  -Failure to Thrive with thirty pound weight loss since may 2019  -Depression/anxiety/insomia -oriented to person and place; daughter reports increase of STML; has 24/7 in-home caregiver's -ambulates with rolling walker; requires assistance with toileting and ADL's -Continue Vitamin D  -previously on remeron w/o results; -Check TSH -recommended ensure/boost BID -Primary MD-consider decreasing Metformin to 500mg  BID decrease risk of hypoglycemia (A2c 5.7) -followed by Dr/ Plosvky (psych) -continue paxil 60mg  Qd and PRN xanax  -Chronic medical problems -HTN -DM -GERD -CAD -HLD -s/p Renal artery bypass -bilateral carotid stenosis -chronic mesenteric ischemia -chronic pain (lower back and abdomen) -Managed by Primary Care physician -Cozaar -Metformin 1,000mg  BID -Prilosec/aciphex -Imdur 78m/24hr -ASA 81mg  -Plavix -PRN tylenol and PRN Ultram    ACP -Detailed discussion regarding Goals of Care- DNR/MOST; signed; no ventilator; antibiotic's to be decided at time of infection ; trial of IVF's -Daughter would like SW visit.   I spent 90 minutes providing this consultation,  from 11:00 to 12:30. More than 50% of the time in this consultation was spent coordinating communication.   HISTORY OF PRESENT ILLNESS:  Kari Colon is a 83 y.o. year old female with multiple medical problems including dementia, gait  disturbance, frequent falls, h/o hip fx s/p ORIF, failure to thrive with 30 lb weight loss since may of 2019. Palliative Care was asked to help with symptom management and to  address goals of care.   CODE STATUS:  DNR-L; MOST; antibiotic's to be decided at time of infection; trial of IVF's no Feeding tube.  PPS: 40% HOSPICE ELIGIBILITY/DIAGNOSIS: TBD  PAST MEDICAL HISTORY:  Past Medical History:  Diagnosis Date  . Aftercare for healing traumatic fracture of hip   . Carotid bruit   . Chronic rhinitis   . Depressive disorder, not elsewhere classified   . Diabetes mellitus without complication ([place])   . Diverticulitis   . Duodenal ulcer   . Factor V deficiency ([place])   . Fibrocystic breast disease   . Fibromuscular dysplasia ([place])   . Generalized pain   . GERD (gastroesophageal reflux disease)   . Heart murmur   . Hyperlipidemia   . 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 Insomnia, unspecified   . Macular degeneration   . Mesenteric ischemia ([place])   . Other abnormal glucose   . Other specified rehabilitation procedure(V57.89)   . Personal history of fall   . Pulmonary HTN ([place])   . Reflux esophagitis   . Renal artery stenosis ([place])   . Subclavian arterial stenosis ([place])   . Tricuspid regurgitation   . Tubular adenoma of colon   . Unspecified constipation   . Unspecified essential hypertension   . Vitamin D deficiency     SOCIAL HX:  Social History   Tobacco Use  . Smoking status: Current Every Day Smoker    Packs/day: 1.00    Types: Cigarettes  . Smokeless tobacco: Former Network engineer Use Topics  . Alcohol use: No    ALLERGIES:  Allergies  Allergen Reactions  . Mirapex [Pramipexole Dihydrochloride] Other (See Comments)    insomnia  . Nexium [Esomeprazole Magnesium] Other (See Comments)    Ache in chest  . Prevacid [Lansoprazole] Other (See Comments)    Ache in chest  . Ticlid [Ticlopidine] Hives  . Wellbutrin [Bupropion] Hives and Other (See  Comments)    lightheadedness  . Zantac [Ranitidine Hcl] Hives  . Amoxil [Amoxicillin] Rash  . Effexor [Venlafaxine] Rash  . Penicillins Rash    Has patient had a PCN reaction causing immediate rash, facial/tongue/throat swelling, SOB or lightheadedness with hypotension: Yes Has patient had a PCN reaction causing severe rash involving mucus membranes or skin necrosis: No Has patient had a PCN reaction that required hospitalization: No Has patient had a PCN reaction occurring within the last 10 years: No If all of the above answers are "NO", then may proceed with Cephalosporin use.     PERTINENT MEDICATIONS:  Outpatient Encounter Medications as of 04/03/2018  Medication Sig  . acetaminophen (TYLENOL) 650 MG CR tablet Take 1,300 mg by mouth every 8 (eight) hours as needed for pain.  Marland Kitchen ALPRAZolam (XANAX) 0.5 MG tablet Take 0.5 mg by mouth 3 (three) times daily as needed for anxiety.  Marland Kitchen aspirin EC 81 MG tablet Take 81 mg by mouth daily.  . cephALEXin (KEFLEX) 500 MG capsule Take 1 capsule (500 mg total) by mouth every 12 (twelve) hours. (Patient not taking: Reported on 02/03/2018)  . cetirizine (ZYRTEC) 10 MG tablet Take 10 mg by mouth daily.  . Cholecalciferol (VITAMIN D) 2000 units tablet Take 2,000 Units by mouth daily.  . clopidogrel (PLAVIX) 75 MG tablet Take 75 mg by mouth daily.  Marland Kitchen docusate sodium (COLACE) 100 MG capsule Take 1 capsule (100 mg total) by mouth 2 (two) times daily. (Patient not taking: Reported on 02/03/2018)  . glycerin adult 2 g suppository Place 1 suppository rectally as needed for constipation.  . isosorbide mononitrate (IMDUR) 30 MG 24 hr tablet Take 30 mg by mouth daily.  Marland Kitchen levofloxacin (LEVAQUIN) 500 MG tablet Take 500 mg by mouth daily.  Marland Kitchen losartan (COZAAR) 50 MG tablet Take 50 mg by mouth daily.  . metFORMIN (GLUCOPHAGE) 500 MG tablet Take 1,000 mg by mouth 2 (two) times daily.  Marland Kitchen omeprazole (PRILOSEC OTC) 20 MG tablet Take 40 mg by mouth daily.  Marland Kitchen PARoxetine (PAXIL)  30 MG tablet Take 60 mg by mouth daily.   . polyethylene glycol (MIRALAX / GLYCOLAX) packet Take 17 g by mouth daily as needed for mild constipation.  . RABEprazole (ACIPHEX) 20 MG tablet Take 20 mg by mouth daily.  . traMADol (ULTRAM) 50 MG tablet Take 2 tablets (100 mg total) by mouth every 8 (eight) hours as needed (pain).   No facility-administered encounter medications on file as of 04/03/2018.     PHYSICAL EXAM:   General: NAD, frail appearing, thin, kyphosis Cardiovascular: regular rate and rhythm Pulmonary: clear ant fields Abdomen: soft, nontender, + bowel sounds GU: no suprapubic tenderness Extremities: no edema, no joint deformities Skin: no rashes Neurological: Weakness but otherwise nonfocal  Zahari Fazzino G [place], NP

## 2018-04-29 DIAGNOSIS — F332 Major depressive disorder, recurrent severe without psychotic features: Secondary | ICD-10-CM | POA: Diagnosis not present

## 2018-05-06 ENCOUNTER — Telehealth: Payer: Self-pay

## 2018-05-06 NOTE — Telephone Encounter (Signed)
VM received from patient's daughter requesting a return call. Phone call placed to patient's daughter who shared that patient has had a significant decline and requesting a visit. Meal intake has declined with suspected weight loss, functional decline and increased weakness. Daughter requested a visit with NP. Scheduled for 05/07/18

## 2018-05-07 ENCOUNTER — Other Ambulatory Visit: Payer: Medicare Other | Admitting: Nurse Practitioner

## 2018-05-07 DIAGNOSIS — F0390 Unspecified dementia without behavioral disturbance: Secondary | ICD-10-CM

## 2018-05-07 DIAGNOSIS — F339 Major depressive disorder, recurrent, unspecified: Secondary | ICD-10-CM

## 2018-05-07 DIAGNOSIS — Z515 Encounter for palliative care: Secondary | ICD-10-CM

## 2018-05-07 DIAGNOSIS — R5383 Other fatigue: Secondary | ICD-10-CM

## 2018-05-07 DIAGNOSIS — R634 Abnormal weight loss: Secondary | ICD-10-CM

## 2018-05-07 DIAGNOSIS — R269 Unspecified abnormalities of gait and mobility: Secondary | ICD-10-CM

## 2018-05-07 DIAGNOSIS — F419 Anxiety disorder, unspecified: Secondary | ICD-10-CM

## 2018-05-07 NOTE — Progress Notes (Signed)
PALLIATIVE CARE CONSULT VISIT   PATIENT NAME: Kari Colon DOB: 1935-02-19 MRN: [id number]  PRIMARY CARE PROVIDER:   Josetta Huddle, MD  REFERRING PROVIDER:  Josetta Huddle, MD [address] [place],  [address]  RESPONSIBLE PARTY:   Gelene Mink [phone]   ASSESSMENT/RECOMMENDATIONS:  1.Primary MD- please consider decreasing Metformin to 500mg  BID decrease risk of hypoglycemia (A2c 5.7) 2. Check TSH due to rapid weight loss   -Dementia wo behavioral disturbance -deconditioning -gait disorder (worsening) -fatigue -frequent falls/high risk -osteoporosis  -Failure to Thrive with thirty pound weight loss since may 2019; exst weight 129 (Dtr reported) Ht. 5\' 4" , BMI 22.1  -Depression/anxiety/insomia -oriented to person and place; daughter reports increase of STML; has 24/7 in-home caregiver's -ambulates with rolling walker shorter distances with assistance to stand and observation; requires more assistance with toileting and ADL's -Continue Vitamin D  -Check TSH -recommended ensure/boost BID -Primary MD-consider decreasing Metformin to 500mg  BID decrease risk of hypoglycemia (A2c 5.7) -reports mood is stable;followed by Dr/ Plosvky (psych); -continue paxil 60mg  Qd and PRN xanax  -Chronic medical problems -HTN -DM -GERD -CAD -HLD -s/p Renal artery bypass -bilateral carotid stenosis -chronic mesenteric ischemia -chronic pain (lower back and abdomen) -Managed by Primary Care physician -Cozaar -Metformin 1,000mg  BID -Prilosec/aciphex -Imdur 92m/24hr -ASA 81mg  -Plavix -PRN tylenol and PRN Ultram is effective for pain    ACP -Detailed discussion regarding Goals of Care- DNR/MOST; signed; no ventilator; antibiotic's to be decided at time of infection ; trial of IVF's -Daughter would like SW visit.   I spent 90 minutes providing this consultation,  from 14:00 to 15:00. More than 50% of the time in this consultation was spent coordinating  communication.   HISTORY OF PRESENT ILLNESS:  Kari Colon is a 83 y.o. year old female with multiple medical problems including dementia, gait disturbance, frequent falls, h/o hip fx s/p ORIF, failure to thrive with 30 lb weight loss since may of 2019. Palliative Care was asked to help with symptom management and to  address goals of care.   CODE STATUS:  DNR-L; MOST; antibiotic's to be decided at time of infection; trial of IVF's no Feeding tube.  PPS: 40% HOSPICE ELIGIBILITY/DIAGNOSIS: TBD  PAST MEDICAL HISTORY:  Past Medical History:  Diagnosis Date  . Aftercare for healing traumatic fracture of hip   . Carotid bruit   . Chronic rhinitis   . Depressive disorder, not elsewhere classified   . Diabetes mellitus without complication ([place])   . Diverticulitis   . Duodenal ulcer   . Factor V deficiency ([place])   . Fibrocystic breast disease   . Fibromuscular dysplasia ([place])   . Generalized pain   . GERD (gastroesophageal reflux disease)   . Heart murmur   . Hyperlipidemia   . 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 Insomnia, unspecified   . Macular degeneration   . Mesenteric ischemia ([place])   . Other abnormal glucose   . Other specified rehabilitation procedure(V57.89)   . Personal history of fall   . Pulmonary HTN ([place])   . Reflux esophagitis   . Renal artery stenosis ([place])   . Subclavian arterial stenosis ([place])   . Tricuspid regurgitation   . Tubular adenoma of colon   . Unspecified constipation   . Unspecified essential hypertension   . Vitamin D deficiency     SOCIAL HX:  Social History   Tobacco Use  . Smoking status: Current Every Day Smoker    Packs/day: 1.00  Types: Cigarettes  . Smokeless tobacco: Former Network engineer Use Topics  . Alcohol use: No    ALLERGIES:  Allergies  Allergen Reactions  . Mirapex [Pramipexole Dihydrochloride] Other (See Comments)    insomnia  . Nexium [Esomeprazole Magnesium] Other (See Comments)    Ache in chest   . Prevacid [Lansoprazole] Other (See Comments)    Ache in chest  . Ticlid [Ticlopidine] Hives  . Wellbutrin [Bupropion] Hives and Other (See Comments)    lightheadedness  . Zantac [Ranitidine Hcl] Hives  . Amoxil [Amoxicillin] Rash  . Effexor [Venlafaxine] Rash  . Penicillins Rash    Has patient had a PCN reaction causing immediate rash, facial/tongue/throat swelling, SOB or lightheadedness with hypotension: Yes Has patient had a PCN reaction causing severe rash involving mucus membranes or skin necrosis: No Has patient had a PCN reaction that required hospitalization: No Has patient had a PCN reaction occurring within the last 10 years: No If all of the above answers are "NO", then may proceed with Cephalosporin use.     PERTINENT MEDICATIONS:  Outpatient Encounter Medications as of 05/07/2018  Medication Sig  . acetaminophen (TYLENOL) 650 MG CR tablet Take 1,300 mg by mouth every 8 (eight) hours as needed for pain.  Marland Kitchen ALPRAZolam (XANAX) 0.5 MG tablet Take 0.5 mg by mouth 3 (three) times daily as needed for anxiety.  Marland Kitchen aspirin EC 81 MG tablet Take 81 mg by mouth daily.  . cephALEXin (KEFLEX) 500 MG capsule Take 1 capsule (500 mg total) by mouth every 12 (twelve) hours.  . cetirizine (ZYRTEC) 10 MG tablet Take 10 mg by mouth daily.  . Cholecalciferol (VITAMIN D) 2000 units tablet Take 2,000 Units by mouth daily.  . clopidogrel (PLAVIX) 75 MG tablet Take 75 mg by mouth daily.  Marland Kitchen docusate sodium (COLACE) 100 MG capsule Take 1 capsule (100 mg total) by mouth 2 (two) times daily. (Patient not taking: Reported on 02/03/2018)  . glycerin adult 2 g suppository Place 1 suppository rectally as needed for constipation.  . isosorbide mononitrate (IMDUR) 30 MG 24 hr tablet Take 30 mg by mouth daily.  Marland Kitchen levofloxacin (LEVAQUIN) 500 MG tablet Take 500 mg by mouth daily.  Marland Kitchen losartan (COZAAR) 50 MG tablet Take 50 mg by mouth daily.  . metFORMIN (GLUCOPHAGE) 500 MG tablet Take 1,000 mg by mouth 2 (two)  times daily.  Marland Kitchen omeprazole (PRILOSEC OTC) 20 MG tablet Take 40 mg by mouth daily.  Marland Kitchen PARoxetine (PAXIL) 30 MG tablet Take 60 mg by mouth daily.   . polyethylene glycol (MIRALAX / GLYCOLAX) packet Take 17 g by mouth daily as needed for mild constipation.  . RABEprazole (ACIPHEX) 20 MG tablet Take 20 mg by mouth daily.  . traMADol (ULTRAM) 50 MG tablet Take 2 tablets (100 mg total) by mouth every 8 (eight) hours as needed (pain).   No facility-administered encounter medications on file as of 05/07/2018.     PHYSICAL EXAM:   General: NAD, frail appearing, thin, kyphosis Cardiovascular: regular rate and rhythm Pulmonary: clear ant fields Abdomen: soft, nontender, + bowel sounds GU: no suprapubic tenderness Extremities: no edema, no joint deformities Skin: no rashes Neurological: Weakness but otherwise nonfocal  Kristilyn Coltrane G [place], NP

## 2018-05-08 ENCOUNTER — Encounter: Payer: Self-pay | Admitting: Nurse Practitioner

## 2018-05-20 DIAGNOSIS — Z23 Encounter for immunization: Secondary | ICD-10-CM | POA: Diagnosis not present

## 2018-05-20 DIAGNOSIS — G4709 Other insomnia: Secondary | ICD-10-CM | POA: Diagnosis not present

## 2018-05-21 ENCOUNTER — Telehealth: Payer: Self-pay

## 2018-05-21 NOTE — Telephone Encounter (Signed)
Received phone call from patient's daughter, Estill Bamberg. Per Estill Bamberg, patient has displayed further decline.Last weight noted to be 112 pounds. This is a 37 pound weight loss since April. Daughter noted muscle wasting.  Patient is dependent on caregiver for ADLS. [facility] d/c services as patient was regressing instead of progressing.  Intake has declined to 50% of small meals. Daughter would like to pursue hospice services. Discussed this with Colletta Maryland NP. Plan to contact PCP and request order for hospice eval

## 2018-05-21 NOTE — Telephone Encounter (Signed)
Phone call placed to PCP office. Message left for Katrina, nurse to Dr. Inda Merlin to provide update and request for hospice eval.

## 2018-06-01 ENCOUNTER — Encounter (HOSPITAL_COMMUNITY): Payer: Self-pay | Admitting: Emergency Medicine

## 2018-06-01 ENCOUNTER — Emergency Department (HOSPITAL_COMMUNITY)
Admission: EM | Admit: 2018-06-01 | Discharge: 2018-06-02 | Disposition: A | Discharge status: Hospice | Attending: Emergency Medicine | Admitting: Emergency Medicine

## 2018-06-01 ENCOUNTER — Other Ambulatory Visit: Payer: Self-pay

## 2018-06-01 DIAGNOSIS — Z515 Encounter for palliative care: Secondary | ICD-10-CM | POA: Diagnosis not present

## 2018-06-01 DIAGNOSIS — Z7902 Long term (current) use of antithrombotics/antiplatelets: Secondary | ICD-10-CM | POA: Diagnosis not present

## 2018-06-01 DIAGNOSIS — R0600 Dyspnea, unspecified: Secondary | ICD-10-CM | POA: Diagnosis not present

## 2018-06-01 DIAGNOSIS — R0602 Shortness of breath: Secondary | ICD-10-CM | POA: Diagnosis not present

## 2018-06-01 DIAGNOSIS — E119 Type 2 diabetes mellitus without complications: Secondary | ICD-10-CM | POA: Diagnosis not present

## 2018-06-01 DIAGNOSIS — F17210 Nicotine dependence, cigarettes, uncomplicated: Secondary | ICD-10-CM | POA: Insufficient documentation

## 2018-06-01 DIAGNOSIS — Z7984 Long term (current) use of oral hypoglycemic drugs: Secondary | ICD-10-CM | POA: Diagnosis not present

## 2018-06-01 DIAGNOSIS — Z7982 Long term (current) use of aspirin: Secondary | ICD-10-CM | POA: Insufficient documentation

## 2018-06-01 MED ORDER — MORPHINE SULFATE (PF) 2 MG/ML IV SOLN
2.0000 mg | Freq: Once | INTRAVENOUS | Status: AC
  Administered 2018-06-01: 2 mg via INTRAVENOUS
  Filled 2018-06-01: qty 1

## 2018-06-01 MED ORDER — MORPHINE SULFATE (PF) 2 MG/ML IV SOLN
2.0000 mg | INTRAVENOUS | Status: DC | PRN
  Administered 2018-06-01 – 2018-06-02 (×3): 2 mg via INTRAVENOUS
  Filled 2018-06-01 (×3): qty 1

## 2018-06-01 MED ORDER — LORAZEPAM 2 MG/ML IJ SOLN
1.0000 mg | Freq: Once | INTRAMUSCULAR | Status: AC
  Administered 2018-06-01: 1 mg via INTRAVENOUS
  Filled 2018-06-01: qty 1

## 2018-06-01 MED ORDER — LORAZEPAM 2 MG/ML IJ SOLN: 1.0000 mg | Freq: Once | INTRAMUSCULAR | Status: DC

## 2018-06-01 NOTE — ED Provider Notes (Signed)
[facility] DEPT Provider Note   CSN: [id number] Arrival date & time: 06/01/18  1550     History   Chief Complaint Chief Complaint  Patient presents with  . Shortness of Breath    HPI Kari Colon is a 83 y.o. female.  HPI   83 year old female with shortness of breath.  Patient is currently on hospice.  She has had progressive decline over the last several months with progressive weight loss and functionality.  States she seemed to be very short of breath.  Hospice is involved in care and patient actually is supposed to go to [facility].  She seemed uncomfortable at home though and they did not have immediate means to care for this so she was brought to the emergency room.  Hospice representative and daughter at bedside.  Daughter confirms wishes for comfort measures only.  She does not want any invasive testing.  She did receive albuterol and Solu-Medrol prior to arrival.    Past Medical History:  Diagnosis Date  . Aftercare for healing traumatic fracture of hip   . Carotid bruit   . Chronic rhinitis   . Depressive disorder, not elsewhere classified   . Diabetes mellitus without complication ([place])   . Diverticulitis   . Duodenal ulcer   . Factor V deficiency ([place])   . Fibrocystic breast disease   . Fibromuscular dysplasia ([place])   . Generalized pain   . GERD (gastroesophageal reflux disease)   . Heart murmur   . Hyperlipidemia   . 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 Insomnia, unspecified   . Macular degeneration   . Mesenteric ischemia ([place])   . Other abnormal glucose   . Other specified rehabilitation procedure(V57.89)   . Personal history of fall   . Pulmonary HTN ([place])   . Reflux esophagitis   . Renal artery stenosis ([place])   . Subclavian arterial stenosis ([place])   . Tricuspid regurgitation   . Tubular adenoma of colon   . Unspecified constipation   . Unspecified essential hypertension   . Vitamin D  deficiency     Patient Active Problem List   Diagnosis Date Noted  . Syncope 12/29/2017  . Carotid bruit 12/29/2017  . Depression with anxiety 12/29/2017  . Factor V deficiency ([place]) 12/29/2017  . GERD (gastroesophageal reflux disease) 12/29/2017  . Heart murmur 12/29/2017    Past Surgical History:  Procedure Laterality Date  . AORTA - SUPERIOR MESENTERIC AND AORTA - RENAL ARTERY BYPASS GRAFT    . APPENDECTOMY    . CHOLECYSTECTOMY    . DILATION AND CURETTAGE OF UTERUS    . HIP SURGERY    . RENAL ARTERY BYPASS    . TONSILLECTOMY       OB History   None      Home Medications    Prior to Admission medications   Medication Sig Start Date End Date Taking? Authorizing Provider  acetaminophen (TYLENOL) 650 MG CR tablet Take 1,300 mg by mouth every 8 (eight) hours as needed for pain.    [provider]  ALPRAZolam Duanne Moron) 0.5 MG tablet Take 0.5 mg by mouth 3 (three) times daily as needed for anxiety. 10/12/17   [provider]  aspirin EC 81 MG tablet Take 81 mg by mouth daily.    [provider]  cephALEXin (KEFLEX) 500 MG capsule Take 1 capsule (500 mg total) by mouth every 12 (twelve) hours. 01/01/18   Geradine Girt, DO  cetirizine (ZYRTEC)  10 MG tablet Take 10 mg by mouth daily.    [provider]  Cholecalciferol (VITAMIN D) 2000 units tablet Take 2,000 Units by mouth daily.    [provider]  clopidogrel (PLAVIX) 75 MG tablet Take 75 mg by mouth daily. 10/19/17   [provider]  docusate sodium (COLACE) 100 MG capsule Take 1 capsule (100 mg total) by mouth 2 (two) times daily. 01/01/18   Geradine Girt, DO  glycerin adult 2 g suppository Place 1 suppository rectally as needed for constipation.    [provider]  isosorbide mononitrate (IMDUR) 30 MG 24 hr tablet Take 30 mg by mouth daily. 08/21/17   [provider]  levofloxacin (LEVAQUIN) 500 MG tablet Take 500 mg by mouth daily. 01/27/18   [provider]  losartan (COZAAR) 50 MG tablet Take 50 mg by mouth daily.    [provider]  metFORMIN (GLUCOPHAGE) 500 MG tablet Take 1,000 mg by mouth 2 (two) times daily. 10/27/17   [provider]  omeprazole (PRILOSEC OTC) 20 MG tablet Take 40 mg by mouth daily.    [provider]  PARoxetine (PAXIL) 30 MG tablet Take 60 mg by mouth daily.  10/14/17   [provider]  polyethylene glycol (MIRALAX / GLYCOLAX) packet Take 17 g by mouth daily as needed for mild constipation. 01/01/18   Geradine Girt, DO  RABEprazole (ACIPHEX) 20 MG tablet Take 20 mg by mouth daily.    [provider]  traMADol (ULTRAM) 50 MG tablet Take 2 tablets (100 mg total) by mouth every 8 (eight) hours as needed (pain). 01/01/18   Geradine Girt, DO    Family History No family history on file.  Social History Social History   Tobacco Use  . Smoking status: Current Every Day Smoker    Packs/day: 1.00    Types: Cigarettes  . Smokeless tobacco: Former Network engineer Use Topics  . Alcohol use: No  . Drug use: No     Allergies   Mirapex [pramipexole dihydrochloride]; Nexium [esomeprazole magnesium]; Prevacid [lansoprazole]; Ticlid [ticlopidine]; Wellbutrin [bupropion]; Zantac [ranitidine hcl]; Amoxil [amoxicillin]; Effexor [venlafaxine]; and Penicillins   Review of Systems Review of Systems  All systems reviewed and negative, other than as noted in HPI.  Physical Exam Updated Vital Signs There were no vitals taken for this visit.  Physical Exam  Constitutional: No distress.  Chronically ill appearing. Appears tired but not toxic.   HENT:  Head: Normocephalic and atraumatic.  Eyes: Conjunctivae are normal. Right eye exhibits no discharge. Left eye exhibits no discharge.  Neck: Neck supple.  Cardiovascular: Normal rate, regular rhythm and normal heart sounds. Exam reveals no gallop and no friction rub.  No murmur heard. Pulmonary/Chest: Effort normal. No  respiratory distress.  Mild tachypnea. B/l rhonchi.   Abdominal: Soft. She exhibits no distension. There is no tenderness.  Musculoskeletal: She exhibits no edema or tenderness.  Neurological: She is alert.  Skin: Skin is warm and dry.  Psychiatric: She has a normal mood and affect. Her behavior is normal. Thought content normal.  Nursing note and vitals reviewed.    ED Treatments / Results  Labs (all labs ordered are listed, but only abnormal results are displayed) Labs Reviewed - No data to display  EKG None  Radiology No results found.  Procedures Procedures (including critical care time)  Medications Ordered in ED Medications  LORazepam (ATIVAN) injection 1 mg (1 mg Intravenous Given 06/01/18 1646)  morphine 2 MG/ML  injection 2 mg (2 mg Intravenous Given 06/01/18 1647)  morphine 2 MG/ML injection 2 mg (2 mg Intravenous Given 06/01/18 1757)     Initial Impression / Assessment and Plan / ED Course  I have reviewed the triage vital signs and the nursing notes.  Pertinent labs & imaging results that were available during my care of the patient were reviewed by me and considered in my medical decision making (see chart for details).     83 year old female with shortness of breath.  On hospice. [place] in ED and met with me and pt's daughter. Daughter confirms wishes for comfort measures only. She is to go to [facility] in the morning. We will watch her in the ED overnight and keep her comfortable until then.   Final Clinical Impressions(s) / ED Diagnoses   Final diagnoses:  Dyspnea, unspecified type  Hospice care    ED Discharge Orders    None       Virgel Manifold, MD 06/05/18 1529

## 2018-06-01 NOTE — ED Notes (Addendum)
Patient resting in room, brief checked and patient cleaned of incontinence and brief changed. Patient states pain 6/10, noticeable tremors noted. Patient repositioned

## 2018-06-01 NOTE — ED Notes (Addendum)
Pt daughter Jake Bathe left call back number [place] [phone] and Pamala Duffel [phone]

## 2018-06-01 NOTE — Progress Notes (Signed)
1730 -- [facility] ([facility] RN Note  Pt at home with paid caregiver with c/o being flushed and more lethargic with congested cough. RN visited patient in the home with [facility], Sats found to be in the 80's, no fever noted. O2 and nebulizer treatment given. Roxanol and Ativan offered as well as O2 and nebulizer to be delivered by [facility]. Daughter requested patient to come to ED for evaluation and symptom management as she felt she could not manage rapid decline in the home.  Met with patient and daughter, Kari Colon, with Dr. Wilson Singer in room. Pt somnolent but aroused when coughing. O2 sats 85-88% on 4 lpm [place]. Pt with tremors. Wet sounding cough. BP 138/24, HR 97, RR 20. Pt denies pain per RN report and does not appear to be in any distress at this time.  Daughter expresses that she desires no workup, comfort focused symptom management only. Per on call hospice RN and [place] Director, plan is for pt to be admitted to [facility] tomorrow morning. Per discussion with Dr. Wilson Singer, pt can remain in ED overnight for symptom management until patient can transfer to [facility] in the morning. Daughter greatly appreciative and agrees with plan. Support given.  Please call with any hospice related questions or concerns.  Thank you, Margaretmary Eddy, RN, [facility] Liaison [place] are on [facility].

## 2018-06-01 NOTE — ED Triage Notes (Signed)
Per [facility] pt from home. Hopsice nurse called 911 due to patient having SOB, congested cough and room air saturation 85%.  Pt is seeking placement at [facility] tomorrow but at home had no resources to treat the SOB.  Pt given Albuterol 15mg , atrovent and Solu-Medrol 125mg  in route.

## 2018-06-01 NOTE — ED Notes (Signed)
Bed: GB84 Expected date:  Expected time:  Means of arrival:  Comments: 83 yo SOB

## 2018-06-02 NOTE — ED Notes (Signed)
[facility] called for pt transport to [facility].

## 2018-06-02 NOTE — ED Notes (Signed)
Patient resting in bed with eyes closed, respirations even and unlabored; no apparent distress noted

## 2018-06-02 NOTE — ED Notes (Signed)
Patient turned to left side, linens changed, patient states "no pain" when asked

## 2018-06-02 NOTE — ED Notes (Signed)
Patient cleaned of incontinence and brief changed, patient moved up in bed and repositioned

## 2018-06-02 NOTE — ED Notes (Signed)
Patient turned to right side.

## 2018-06-02 NOTE — ED Notes (Signed)
Patient repositioned

## 2018-06-02 NOTE — ED Notes (Signed)
Patient repositioned in bed.

## 2018-06-02 NOTE — Progress Notes (Signed)
[facility]) hospital liaison note.  Patient has been approved to transfer to [facility] this morning. Met with patient and daughter to confirm interest and explain services. Family agreeable to transfer today. Registration paper work completed.    Please fax discharge summary to [phone]. RN please call report to [phone]. Please arrange transport with [facility].   Thank you,  Farrel Gordon, RN, [facility] Liaison  [place] are on [facility]

## 2018-06-02 NOTE — ED Notes (Signed)
Patient attempted to use bedpan but unable at this time. Pt cleaned with soap and water, bed linen changed and new brief applied. Pt pulled up and repositioned in bed.

## 2018-06-02 NOTE — ED Notes (Signed)
Patients daughter and hospice nurse at bedside.

## 2018-06-02 NOTE — ED Notes (Signed)
Phone report called to Arbie Cookey, Therapist, sports at [facility].

## 2018-06-02 NOTE — ED Notes (Signed)
Patient in bed with eyes closed, respirations even and unlabored, in no apparent distress at this time

## 2018-06-02 NOTE — ED Notes (Signed)
Attempted to call report to [facility]. Kari Colon will have someone call back asap for report bc they are having huddle at this time.

## 2018-07-01 DIAGNOSIS — R627 Adult failure to thrive: Secondary | ICD-10-CM | POA: Diagnosis not present

## 2018-07-01 DIAGNOSIS — I10 Essential (primary) hypertension: Secondary | ICD-10-CM | POA: Diagnosis not present

## 2018-07-01 DIAGNOSIS — E119 Type 2 diabetes mellitus without complications: Secondary | ICD-10-CM | POA: Diagnosis not present

## 2018-07-01 DIAGNOSIS — G8222 Paraplegia, incomplete: Secondary | ICD-10-CM | POA: Diagnosis not present

## 2019-01-27 DIAGNOSIS — Z9181 History of falling: Secondary | ICD-10-CM | POA: Diagnosis not present

## 2019-01-27 DIAGNOSIS — K5901 Slow transit constipation: Secondary | ICD-10-CM | POA: Diagnosis not present

## 2019-01-27 DIAGNOSIS — G8222 Paraplegia, incomplete: Secondary | ICD-10-CM | POA: Diagnosis not present

## 2019-01-27 DIAGNOSIS — F331 Major depressive disorder, recurrent, moderate: Secondary | ICD-10-CM | POA: Diagnosis not present

## 2019-02-07 DIAGNOSIS — R627 Adult failure to thrive: Secondary | ICD-10-CM | POA: Diagnosis not present

## 2019-07-22 ENCOUNTER — Encounter: Payer: Self-pay | Admitting: Family Medicine

## 2019-09-17 DIAGNOSIS — E46 Unspecified protein-calorie malnutrition: Secondary | ICD-10-CM | POA: Diagnosis not present

## 2019-09-17 DIAGNOSIS — E1159 Type 2 diabetes mellitus with other circulatory complications: Secondary | ICD-10-CM | POA: Diagnosis not present

## 2019-09-17 DIAGNOSIS — I10 Essential (primary) hypertension: Secondary | ICD-10-CM | POA: Diagnosis not present

## 2019-09-23 DIAGNOSIS — E785 Hyperlipidemia, unspecified: Secondary | ICD-10-CM | POA: Diagnosis not present

## 2019-09-23 DIAGNOSIS — E1159 Type 2 diabetes mellitus with other circulatory complications: Secondary | ICD-10-CM | POA: Diagnosis not present

## 2019-09-23 DIAGNOSIS — K219 Gastro-esophageal reflux disease without esophagitis: Secondary | ICD-10-CM | POA: Diagnosis not present

## 2019-09-23 DIAGNOSIS — F339 Major depressive disorder, recurrent, unspecified: Secondary | ICD-10-CM | POA: Diagnosis not present

## 2019-09-30 DIAGNOSIS — K219 Gastro-esophageal reflux disease without esophagitis: Secondary | ICD-10-CM | POA: Diagnosis not present

## 2019-09-30 DIAGNOSIS — E119 Type 2 diabetes mellitus without complications: Secondary | ICD-10-CM | POA: Diagnosis not present

## 2019-09-30 DIAGNOSIS — F339 Major depressive disorder, recurrent, unspecified: Secondary | ICD-10-CM | POA: Diagnosis not present

## 2019-09-30 DIAGNOSIS — E785 Hyperlipidemia, unspecified: Secondary | ICD-10-CM | POA: Diagnosis not present

## 2019-10-07 DIAGNOSIS — E1159 Type 2 diabetes mellitus with other circulatory complications: Secondary | ICD-10-CM | POA: Diagnosis not present

## 2019-10-07 DIAGNOSIS — E785 Hyperlipidemia, unspecified: Secondary | ICD-10-CM | POA: Diagnosis not present

## 2019-10-07 DIAGNOSIS — K219 Gastro-esophageal reflux disease without esophagitis: Secondary | ICD-10-CM | POA: Diagnosis not present

## 2019-10-07 DIAGNOSIS — I10 Essential (primary) hypertension: Secondary | ICD-10-CM | POA: Diagnosis not present

## 2019-10-13 DIAGNOSIS — F339 Major depressive disorder, recurrent, unspecified: Secondary | ICD-10-CM | POA: Diagnosis not present

## 2019-10-14 DIAGNOSIS — E785 Hyperlipidemia, unspecified: Secondary | ICD-10-CM | POA: Diagnosis not present

## 2019-10-14 DIAGNOSIS — I10 Essential (primary) hypertension: Secondary | ICD-10-CM | POA: Diagnosis not present

## 2019-10-14 DIAGNOSIS — F339 Major depressive disorder, recurrent, unspecified: Secondary | ICD-10-CM | POA: Diagnosis not present

## 2019-10-14 DIAGNOSIS — E1159 Type 2 diabetes mellitus with other circulatory complications: Secondary | ICD-10-CM | POA: Diagnosis not present

## 2019-10-15 DIAGNOSIS — E785 Hyperlipidemia, unspecified: Secondary | ICD-10-CM | POA: Diagnosis not present

## 2019-10-15 DIAGNOSIS — I10 Essential (primary) hypertension: Secondary | ICD-10-CM | POA: Diagnosis not present

## 2019-10-15 DIAGNOSIS — J301 Allergic rhinitis due to pollen: Secondary | ICD-10-CM | POA: Diagnosis not present

## 2019-10-15 DIAGNOSIS — E46 Unspecified protein-calorie malnutrition: Secondary | ICD-10-CM | POA: Diagnosis not present

## 2019-11-12 DIAGNOSIS — F339 Major depressive disorder, recurrent, unspecified: Secondary | ICD-10-CM | POA: Diagnosis not present

## 2019-11-12 DIAGNOSIS — E1159 Type 2 diabetes mellitus with other circulatory complications: Secondary | ICD-10-CM | POA: Diagnosis not present

## 2019-11-23 DIAGNOSIS — I10 Essential (primary) hypertension: Secondary | ICD-10-CM | POA: Diagnosis not present

## 2019-11-23 DIAGNOSIS — E119 Type 2 diabetes mellitus without complications: Secondary | ICD-10-CM | POA: Diagnosis not present

## 2019-11-23 DIAGNOSIS — R197 Diarrhea, unspecified: Secondary | ICD-10-CM | POA: Diagnosis not present

## 2019-11-23 DIAGNOSIS — K5900 Constipation, unspecified: Secondary | ICD-10-CM | POA: Diagnosis not present

## 2019-11-24 DIAGNOSIS — A0472 Enterocolitis due to Clostridium difficile, not specified as recurrent: Secondary | ICD-10-CM | POA: Diagnosis not present

## 2019-11-24 DIAGNOSIS — F419 Anxiety disorder, unspecified: Secondary | ICD-10-CM | POA: Diagnosis not present

## 2019-11-24 DIAGNOSIS — A0471 Enterocolitis due to Clostridium difficile, recurrent: Secondary | ICD-10-CM | POA: Diagnosis not present

## 2019-11-24 DIAGNOSIS — F339 Major depressive disorder, recurrent, unspecified: Secondary | ICD-10-CM | POA: Diagnosis not present

## 2019-11-29 ENCOUNTER — Emergency Department (HOSPITAL_COMMUNITY)

## 2019-11-29 ENCOUNTER — Encounter (HOSPITAL_COMMUNITY): Payer: Self-pay

## 2019-11-29 ENCOUNTER — Inpatient Hospital Stay (HOSPITAL_COMMUNITY)
Admission: EM | Admit: 2019-11-29 | Discharge: 2019-12-02 | DRG: 522 | Disposition: A | Discharge status: Skilled Nursing Facility | Source: Skilled Nursing Facility | Attending: Internal Medicine | Admitting: Internal Medicine

## 2019-11-29 ENCOUNTER — Other Ambulatory Visit: Payer: Self-pay

## 2019-11-29 DIAGNOSIS — N390 Urinary tract infection, site not specified: Secondary | ICD-10-CM | POA: Diagnosis present

## 2019-11-29 DIAGNOSIS — W050XXA Fall from non-moving wheelchair, initial encounter: Secondary | ICD-10-CM | POA: Diagnosis present

## 2019-11-29 DIAGNOSIS — Z515 Encounter for palliative care: Secondary | ICD-10-CM | POA: Diagnosis present

## 2019-11-29 DIAGNOSIS — H3530 Unspecified macular degeneration: Secondary | ICD-10-CM | POA: Diagnosis present

## 2019-11-29 DIAGNOSIS — Z79899 Other long term (current) drug therapy: Secondary | ICD-10-CM

## 2019-11-29 DIAGNOSIS — Z833 Family history of diabetes mellitus: Secondary | ICD-10-CM

## 2019-11-29 DIAGNOSIS — S72009A Fracture of unspecified part of neck of unspecified femur, initial encounter for closed fracture: Secondary | ICD-10-CM | POA: Diagnosis present

## 2019-11-29 DIAGNOSIS — I739 Peripheral vascular disease, unspecified: Secondary | ICD-10-CM | POA: Diagnosis present

## 2019-11-29 DIAGNOSIS — Z471 Aftercare following joint replacement surgery: Secondary | ICD-10-CM | POA: Diagnosis not present

## 2019-11-29 DIAGNOSIS — G894 Chronic pain syndrome: Secondary | ICD-10-CM | POA: Diagnosis present

## 2019-11-29 DIAGNOSIS — F17210 Nicotine dependence, cigarettes, uncomplicated: Secondary | ICD-10-CM | POA: Diagnosis present

## 2019-11-29 DIAGNOSIS — Z96641 Presence of right artificial hip joint: Secondary | ICD-10-CM | POA: Diagnosis present

## 2019-11-29 DIAGNOSIS — Z880 Allergy status to penicillin: Secondary | ICD-10-CM | POA: Diagnosis not present

## 2019-11-29 DIAGNOSIS — D682 Hereditary deficiency of other clotting factors: Secondary | ICD-10-CM | POA: Diagnosis present

## 2019-11-29 DIAGNOSIS — Y92129 Unspecified place in nursing home as the place of occurrence of the external cause: Secondary | ICD-10-CM

## 2019-11-29 DIAGNOSIS — N1831 Chronic kidney disease, stage 3a: Secondary | ICD-10-CM | POA: Diagnosis present

## 2019-11-29 DIAGNOSIS — F418 Other specified anxiety disorders: Secondary | ICD-10-CM | POA: Diagnosis present

## 2019-11-29 DIAGNOSIS — I959 Hypotension, unspecified: Secondary | ICD-10-CM | POA: Diagnosis not present

## 2019-11-29 DIAGNOSIS — E538 Deficiency of other specified B group vitamins: Secondary | ICD-10-CM | POA: Diagnosis present

## 2019-11-29 DIAGNOSIS — Z888 Allergy status to other drugs, medicaments and biological substances status: Secondary | ICD-10-CM

## 2019-11-29 DIAGNOSIS — D6851 Activated protein C resistance: Secondary | ICD-10-CM | POA: Diagnosis present

## 2019-11-29 DIAGNOSIS — I10 Essential (primary) hypertension: Secondary | ICD-10-CM | POA: Diagnosis not present

## 2019-11-29 DIAGNOSIS — S72002A Fracture of unspecified part of neck of left femur, initial encounter for closed fracture: Secondary | ICD-10-CM | POA: Diagnosis not present

## 2019-11-29 DIAGNOSIS — D62 Acute posthemorrhagic anemia: Secondary | ICD-10-CM | POA: Diagnosis not present

## 2019-11-29 DIAGNOSIS — R52 Pain, unspecified: Secondary | ICD-10-CM | POA: Diagnosis not present

## 2019-11-29 DIAGNOSIS — Z66 Do not resuscitate: Secondary | ICD-10-CM | POA: Diagnosis present

## 2019-11-29 DIAGNOSIS — Z79891 Long term (current) use of opiate analgesic: Secondary | ICD-10-CM | POA: Diagnosis not present

## 2019-11-29 DIAGNOSIS — E1165 Type 2 diabetes mellitus with hyperglycemia: Secondary | ICD-10-CM | POA: Diagnosis present

## 2019-11-29 DIAGNOSIS — I129 Hypertensive chronic kidney disease with stage 1 through stage 4 chronic kidney disease, or unspecified chronic kidney disease: Secondary | ICD-10-CM | POA: Diagnosis present

## 2019-11-29 DIAGNOSIS — Z96642 Presence of left artificial hip joint: Secondary | ICD-10-CM | POA: Diagnosis not present

## 2019-11-29 DIAGNOSIS — Z96649 Presence of unspecified artificial hip joint: Secondary | ICD-10-CM

## 2019-11-29 DIAGNOSIS — E872 Acidosis: Secondary | ICD-10-CM | POA: Diagnosis present

## 2019-11-29 DIAGNOSIS — M25552 Pain in left hip: Secondary | ICD-10-CM

## 2019-11-29 DIAGNOSIS — E1122 Type 2 diabetes mellitus with diabetic chronic kidney disease: Secondary | ICD-10-CM | POA: Diagnosis present

## 2019-11-29 DIAGNOSIS — W19XXXA Unspecified fall, initial encounter: Secondary | ICD-10-CM

## 2019-11-29 DIAGNOSIS — Z8711 Personal history of peptic ulcer disease: Secondary | ICD-10-CM

## 2019-11-29 DIAGNOSIS — Z20822 Contact with and (suspected) exposure to covid-19: Secondary | ICD-10-CM | POA: Diagnosis present

## 2019-11-29 DIAGNOSIS — Z419 Encounter for procedure for purposes other than remedying health state, unspecified: Secondary | ICD-10-CM

## 2019-11-29 DIAGNOSIS — I272 Pulmonary hypertension, unspecified: Secondary | ICD-10-CM | POA: Diagnosis present

## 2019-11-29 LAB — CBC WITH DIFFERENTIAL/PLATELET
Abs Immature Granulocytes: 0.08 10*3/uL — ABNORMAL HIGH (ref 0.00–0.07)
Basophils Absolute: 0.1 10*3/uL (ref 0.0–0.1)
Basophils Relative: 0 %
Eosinophils Absolute: 0.1 10*3/uL (ref 0.0–0.5)
Eosinophils Relative: 1 %
HCT: 40.5 % (ref 36.0–46.0)
Hemoglobin: 13.2 g/dL (ref 12.0–15.0)
Immature Granulocytes: 1 %
Lymphocytes Relative: 13 %
Lymphs Abs: 1.6 10*3/uL (ref 0.7–4.0)
MCH: 32 pg (ref 26.0–34.0)
MCHC: 32.6 g/dL (ref 30.0–36.0)
MCV: 98.3 fL (ref 80.0–100.0)
Monocytes Absolute: 0.7 10*3/uL (ref 0.1–1.0)
Monocytes Relative: 6 %
Neutro Abs: 9.8 10*3/uL — ABNORMAL HIGH (ref 1.7–7.7)
Neutrophils Relative %: 79 %
Platelets: 242 10*3/uL (ref 150–400)
RBC: 4.12 MIL/uL (ref 3.87–5.11)
RDW: 13.1 % (ref 11.5–15.5)
WBC: 12.3 10*3/uL — ABNORMAL HIGH (ref 4.0–10.5)
nRBC: 0 % (ref 0.0–0.2)

## 2019-11-29 LAB — COMPREHENSIVE METABOLIC PANEL
ALT: 14 U/L (ref 0–44)
AST: 17 U/L (ref 15–41)
Albumin: 3.8 g/dL (ref 3.5–5.0)
Alkaline Phosphatase: 48 U/L (ref 38–126)
Anion gap: 10 (ref 5–15)
BUN: 21 mg/dL (ref 8–23)
CO2: 24 mmol/L (ref 22–32)
Calcium: 8.9 mg/dL (ref 8.9–10.3)
Chloride: 101 mmol/L (ref 98–111)
Creatinine, Ser: 1.03 mg/dL — ABNORMAL HIGH (ref 0.44–1.00)
GFR calc Af Amer: 58 mL/min — ABNORMAL LOW (ref >60)
GFR calc non Af Amer: 50 mL/min — ABNORMAL LOW (ref >60)
Glucose, Bld: 138 mg/dL — ABNORMAL HIGH (ref 70–99)
Potassium: 4.2 mmol/L (ref 3.5–5.1)
Sodium: 135 mmol/L (ref 135–145)
Total Bilirubin: 0.5 mg/dL (ref 0.3–1.2)
Total Protein: 6.2 g/dL — ABNORMAL LOW (ref 6.5–8.1)

## 2019-11-29 LAB — RESPIRATORY PANEL BY RT PCR (FLU A&B, COVID)
Influenza A by PCR: NEGATIVE
Influenza B by PCR: NEGATIVE
SARS Coronavirus 2 by RT PCR: NEGATIVE

## 2019-11-29 MED ORDER — FENTANYL CITRATE (PF) 100 MCG/2ML IJ SOLN
100.0000 ug | Freq: Once | INTRAMUSCULAR | Status: AC
  Administered 2019-11-29: 20:00:00 100 ug via INTRAVENOUS
  Filled 2019-11-29: qty 2

## 2019-11-29 NOTE — ED Provider Notes (Signed)
  Face-to-face evaluation   History: She presents for evaluation of fall from wheelchair, with suspected left hip injury.  Patient complains of pain in the left lateral abdomen, to her left mid thigh.  She cannot give details of the incident.  Physical exam: Patient, alert and cooperative she is uncomfortable.  Abdomen soft nontender.  Chest wall nontender to palpation.  Left hip is internally rotated left leg shortened.  She has tenderness with palpation of left hip pain movement of the left leg causes left hip pain.  Case discussed with orthopedics, Dr. Erlinda Hong, who will see the patient, for orthopedic management, and request that she be transferred to [facility].  Case discussed with Dr. Hal Hope, who will admit as a hospitalist.  Medical screening examination/treatment/procedure(s) were conducted as a shared visit with non-physician practitioner(s) and myself.  I personally evaluated the patient during the encounter    Daleen Bo, MD 12/01/19 [phone]

## 2019-11-29 NOTE — Progress Notes (Signed)
[facility] Documentation  Pt is a current pt of [facility] hosp[facility] with terminal dx of protein calorie malnutrition at [facility]. Pt's daughter, Estill Bamberg, notified [facility] triage nurse that pt was on the way to the hospital due to falling out of wheelchair and c/o hip pain.   Pt has DNR on file with [facility]. Liaison will continue to follow during course in ED and if pt is admitted.   Please do not hesitate to outreach with any questions.  Thank you,  Freddie Breech, RN [facility] Liaison [phone]

## 2019-11-29 NOTE — Progress Notes (Signed)
I have spoken to patient's daughter Estill Bamberg and she is in agreement with surgical plan to perform partial hip replacement tomorrow at [facility]. Please hold lovenox for surgery tomorrow.

## 2019-11-29 NOTE — ED Triage Notes (Signed)
Pt BIB EMS from [facility]. Pt was sitting in her wheelchair, reached for something, and fell out of the wheelchair on her left hip. Shortening and rotation noted. Denies hitting her head or taking blood thinners. A&O x4.   100 mcg Fentanyl 20G RAC  190/78 HR 66 99% RA RR 20 CBG 108

## 2019-11-29 NOTE — ED Provider Notes (Signed)
[facility] DEPT Provider Note   CSN: [id number] Arrival date & time: 11/29/19  1816     History Chief Complaint  Patient presents with  . Fall  . Hip Pain    Kari Colon is a 85 y.o. Colon.  85 y.o Colon with a PMH of Factor V, Fibromuscular dysplasia, Mesenteric ischemia presents to the ED via EMS with a chief complaint of fall. Patient was attempting to throw a piece of paper while sitting on her wheel chair when she suddenly fell off her wheelchair landing on the left side of her hip, she reports pain to the area. She is not ambulatory at baseline according to daughter, due to failure to thrive?. Patient denies an other injury. She is currently on a daily aspirin but no blood thinners.   The history is provided by the patient and medical records.  Fall Pertinent negatives include no chest pain, no abdominal pain, no headaches and no shortness of breath.  Hip Pain Pertinent negatives include no chest pain, no abdominal pain, no headaches and no shortness of breath.       Past Medical History:  Diagnosis Date  . Aftercare for healing traumatic fracture of hip   . Carotid bruit   . Chronic rhinitis   . Depressive disorder, not elsewhere classified   . Diabetes mellitus without complication ([place])   . Diverticulitis   . Duodenal ulcer   . Factor V deficiency ([place])   . Fibrocystic breast disease   . Fibromuscular dysplasia ([place])   . Generalized pain   . GERD (gastroesophageal reflux disease)   . Heart murmur   . Hyperlipidemia   . 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 Insomnia, unspecified   . Macular degeneration   . Mesenteric ischemia ([place])   . Other abnormal glucose   . Other specified rehabilitation procedure(V57.89)   . Personal history of fall   . Pulmonary HTN ([place])   . Reflux esophagitis   . Renal artery stenosis ([place])   . Subclavian arterial stenosis ([place])   . Tricuspid regurgitation   . Tubular adenoma of  colon   . Unspecified constipation   . Unspecified essential hypertension   . Vitamin D deficiency     Patient Active Problem List   Diagnosis Date Noted  . Syncope 12/29/2017  . Carotid bruit 12/29/2017  . Depression with anxiety 12/29/2017  . Factor V deficiency ([place]) 12/29/2017  . GERD (gastroesophageal reflux disease) 12/29/2017  . Heart murmur 12/29/2017    Past Surgical History:  Procedure Laterality Date  . AORTA - SUPERIOR MESENTERIC AND AORTA - RENAL ARTERY BYPASS GRAFT    . APPENDECTOMY    . CHOLECYSTECTOMY    . DILATION AND CURETTAGE OF UTERUS    . HIP SURGERY    . RENAL ARTERY BYPASS    . TONSILLECTOMY       OB History   No obstetric history on file.     History reviewed. No pertinent family history.  Social History   Tobacco Use  . Smoking status: Current Every Day Smoker    Packs/day: 1.00    Types: Cigarettes  . Smokeless tobacco: Former Network engineer Use Topics  . Alcohol use: No  . Drug use: No    Home Medications Prior to Admission medications   Medication Sig Start Date End Date Taking? Authorizing Provider  acetaminophen (TYLENOL) 650 MG CR tablet Take 650 mg by mouth in the morning, at noon, and at bedtime.  Yes [provider]  LORazepam (ATIVAN) 1 MG tablet Take 1 mg by mouth 3 (three) times daily. 10/30/19  Yes [provider]  methadone (DOLOPHINE) 5 MG tablet Take 2.5 mg by mouth as directed.  11/06/19  Yes [provider]  psyllium (REGULOID) 0.52 g capsule Take 0.52 g by mouth at bedtime.   Yes [provider]  sennosides-docusate sodium (SENOKOT-S) 8.6-50 MG tablet Take 1 tablet by mouth at bedtime.   Yes [provider]  sertraline (ZOLOFT) 25 MG tablet Take 25 mg by mouth daily. 10/14/19  Yes [provider]  cephALEXin (KEFLEX) 500 MG capsule Take 1 capsule (500 mg total) by mouth every 12 (twelve) hours. Patient not taking: Reported on 11/29/2019 01/01/18   Geradine Girt, DO    docusate sodium (COLACE) 100 MG capsule Take 1 capsule (100 mg total) by mouth 2 (two) times daily. Patient not taking: Reported on 11/29/2019 01/01/18   Geradine Girt, DO  polyethylene glycol [facility] / GLYCOLAX) packet Take 17 g by mouth daily as needed for mild constipation. Patient not taking: Reported on 11/29/2019 01/01/18   Geradine Girt, DO  traMADol (ULTRAM) 50 MG tablet Take 2 tablets (100 mg total) by mouth every 8 (eight) hours as needed (pain). Patient not taking: Reported on 11/29/2019 01/01/18   Geradine Girt, DO    Allergies    Mirapex [pramipexole dihydrochloride], Nexium [esomeprazole magnesium], Prednisone, Prevacid [lansoprazole], Ticlid [ticlopidine], Wellbutrin [bupropion], Zantac [ranitidine hcl], Amoxil [amoxicillin], Effexor [venlafaxine], and Penicillins  Review of Systems   Review of Systems  Constitutional: Negative for fever.  HENT: Negative for sore throat.   Respiratory: Negative for shortness of breath.   Cardiovascular: Negative for chest pain.  Gastrointestinal: Negative for abdominal pain.  Genitourinary: Negative for flank pain.  Musculoskeletal: Positive for arthralgias and myalgias.  Skin: Negative for pallor and wound.  Neurological: Negative for light-headedness and headaches.  All other systems reviewed and are negative.   Physical Exam Updated Vital Signs BP (!) 167/68   Pulse 64   Temp 99 F (37.2 C)   Resp 19   SpO2 97%   Physical Exam Vitals and nursing note reviewed.  Constitutional:      Appearance: Normal appearance.     Comments: Appears in no distress.   HENT:     Head: Normocephalic and atraumatic.     Nose: Nose normal.  Eyes:     Pupils: Pupils are equal, round, and reactive to light.  Cardiovascular:     Rate and Rhythm: Normal rate.  Pulmonary:     Effort: Pulmonary effort is normal.     Comments: Clear with any wheezing, rhonchi or rales.  Abdominal:     General: Abdomen is flat.     Tenderness: There is no  abdominal tenderness. There is no right CVA tenderness, left CVA tenderness or guarding.  Musculoskeletal:     Cervical back: Normal range of motion and neck supple.     Left hip: Deformity, tenderness and bony tenderness present. Decreased range of motion. Decreased strength.     Comments: Hip is shortened and externally rotated.   Skin:    General: Skin is warm and dry.  Neurological:     Mental Status: She is alert and oriented to person, place, and time.     ED Results / Procedures / Treatments   Labs (all labs ordered are listed, but only abnormal results are displayed) Labs Reviewed  CBC WITH DIFFERENTIAL/PLATELET - Abnormal; Notable  for the following components:      Result Value   WBC 12.3 (*)    Neutro Abs 9.8 (*)    Abs Immature Granulocytes 0.08 (*)    All other components within normal limits  COMPREHENSIVE METABOLIC PANEL - Abnormal; Notable for the following components:   Glucose, Bld 138 (*)    Creatinine, Ser 1.03 (*)    Total Protein 6.2 (*)    GFR calc non Af Amer 50 (*)    GFR calc Af Amer 58 (*)    All other components within normal limits  RESPIRATORY PANEL BY RT PCR (FLU A&B, COVID)    EKG None  Radiology DG Chest Port 1 View  Result Date: 11/29/2019 CLINICAL DATA:  Left hip fracture, preoperative evaluation, hypertension, tobacco abuse EXAM: PORTABLE CHEST 1 VIEW COMPARISON:  02/03/2018 FINDINGS: Single frontal view of the chest demonstrates an unremarkable cardiac silhouette. Minimal atherosclerosis within the aortic arch. No airspace disease, effusion, or pneumothorax. No acute bony abnormality. IMPRESSION: 1. No acute intrathoracic process. Electronically Signed   By: Randa Ngo M.D.   On: 11/29/2019 20:13   DG Hip Unilat W or Wo Pelvis 2-3 Views Left  Result Date: 11/29/2019 CLINICAL DATA:  85 year old Colon with fall and left hip pain. EXAM: DG HIP (WITH OR WITHOUT PELVIS) 2-3V LEFT COMPARISON:  None. FINDINGS: There is a mildly displaced  fracture of the left femoral neck. No dislocation. There is a right hip arthroplasty. The bones are osteopenic. The soft tissues are unremarkable. A pessary is noted over the pelvis. IMPRESSION: Mildly displaced fracture of the left femoral neck. No dislocation. Electronically Signed   By: Anner Crete M.D.   On: 11/29/2019 19:07    Procedures Procedures (including critical care time)  Medications Ordered in ED Medications  fentaNYL (SUBLIMAZE) injection 100 mcg (100 mcg Intravenous Given 11/29/19 2001)    ED Course  I have reviewed the triage vital signs and the nursing notes.  Pertinent labs & imaging results that were available during my care of the patient were reviewed by me and considered in my medical decision making (see chart for details).    MDM Rules/Calculators/A&P    Patient arrived from [facility] s/p fall from her wheelchair. Not on any blood thinners, she is on a daily aspirin.  She has a previous history of a right hip replacement, patient is nonambulatory at baseline as she is currently wheelchair-bound. She is receiving hospice care by [facility].   Xray showed: Mildly displaced fracture of the left femoral neck. No dislocation.  Plan for surgical consultation along with preop labs, EKG, chest xray.   8:12PM Extensive conversation with patient's daughter at the bedside who reports patient had a difficult time getting right hip surgery last time, she is reports "patient cannot go through another surgery", she is currently on hospice care "for failure to thrive". She is requesting hospice coordinator to be called, phone number is [phone] call placed.  8:15 PM Spoke to [employer].  She expresses that under hospice care patient can be managed if postsurgical or comfort measures, they are on board with continuing to care for patient at [facility].  I have discussed case with Dr. Eulis Foster who will evaluate patient.   Interpretation of her  labs with a slight elevation in CBC, no signs of Anemia. CMP without electrolyte abnormality, creatine slightly elevated but within normal limits. LFTs are unremarkable. EKG is NSR. Chest xray is normal.   8:55 PM Patient's  family and patient are agreeable of repair via surgical fixation. Call placed for orthopedic consultation.   9:05 PM Labs are within normal limits, called placed for hospitalist medicine for admission.   Patient signed out to Dr. Eulis Foster pending consult and admission.    Portions of this note were generated with [employer]. Dictation errors may occur despite best attempts at proofreading.  Final Clinical Impression(s) / ED Diagnoses Final diagnoses:  Fall, initial encounter  Closed fracture of left hip, initial encounter [facility])    Rx / DC Orders ED Discharge Orders    None       Corinna Capra 11/29/19 2110    Daleen Bo, MD 12/01/19 [phone]

## 2019-11-30 ENCOUNTER — Encounter (HOSPITAL_COMMUNITY): Payer: Self-pay | Admitting: Internal Medicine

## 2019-11-30 ENCOUNTER — Inpatient Hospital Stay (HOSPITAL_COMMUNITY)

## 2019-11-30 ENCOUNTER — Encounter (HOSPITAL_COMMUNITY)
Admission: EM | Disposition: A | Discharge status: Skilled Nursing Facility | Payer: Self-pay | Source: Skilled Nursing Facility | Attending: Internal Medicine

## 2019-11-30 ENCOUNTER — Inpatient Hospital Stay (HOSPITAL_COMMUNITY): Admitting: Certified Registered Nurse Anesthetist

## 2019-11-30 DIAGNOSIS — S72002A Fracture of unspecified part of neck of left femur, initial encounter for closed fracture: Principal | ICD-10-CM

## 2019-11-30 HISTORY — PX: ANTERIOR APPROACH HEMI HIP ARTHROPLASTY: SHX6690

## 2019-11-30 LAB — CBC
HCT: 38.4 % (ref 36.0–46.0)
HCT: 38.3 % (ref 36.0–46.0)
Hemoglobin: 12.5 g/dL (ref 12.0–15.0)
Hemoglobin: 12.5 g/dL (ref 12.0–15.0)
MCH: 32.1 pg (ref 26.0–34.0)
MCH: 31.5 pg (ref 26.0–34.0)
MCHC: 32.6 g/dL (ref 30.0–36.0)
MCHC: 32.6 g/dL (ref 30.0–36.0)
MCV: 98.7 fL (ref 80.0–100.0)
MCV: 96.5 fL (ref 80.0–100.0)
Platelets: 224 10*3/uL (ref 150–400)
Platelets: 240 10*3/uL (ref 150–400)
RBC: 3.89 MIL/uL (ref 3.87–5.11)
RBC: 3.97 MIL/uL (ref 3.87–5.11)
RDW: 13.2 % (ref 11.5–15.5)
RDW: 13.1 % (ref 11.5–15.5)
WBC: 13.6 10*3/uL — ABNORMAL HIGH (ref 4.0–10.5)
WBC: 16.4 10*3/uL — ABNORMAL HIGH (ref 4.0–10.5)
nRBC: 0 % (ref 0.0–0.2)
nRBC: 0 % (ref 0.0–0.2)

## 2019-11-30 LAB — BASIC METABOLIC PANEL
Anion gap: 10 (ref 5–15)
BUN: 21 mg/dL (ref 8–23)
CO2: 23 mmol/L (ref 22–32)
Calcium: 8.7 mg/dL — ABNORMAL LOW (ref 8.9–10.3)
Chloride: 104 mmol/L (ref 98–111)
Creatinine, Ser: 0.85 mg/dL (ref 0.44–1.00)
GFR calc Af Amer: >60 mL/min (ref >60)
GFR calc non Af Amer: >60 mL/min (ref >60)
Glucose, Bld: 142 mg/dL — ABNORMAL HIGH (ref 70–99)
Potassium: 4.2 mmol/L (ref 3.5–5.1)
Sodium: 137 mmol/L (ref 135–145)

## 2019-11-30 LAB — CBG MONITORING, ED
Glucose-Capillary: 159 mg/dL — ABNORMAL HIGH (ref 70–99)
Glucose-Capillary: 95 mg/dL (ref 70–99)

## 2019-11-30 LAB — GLUCOSE, CAPILLARY
Glucose-Capillary: 94 mg/dL (ref 70–99)
Glucose-Capillary: 159 mg/dL — ABNORMAL HIGH (ref 70–99)
Glucose-Capillary: 153 mg/dL — ABNORMAL HIGH (ref 70–99)
Glucose-Capillary: 191 mg/dL — ABNORMAL HIGH (ref 70–99)

## 2019-11-30 LAB — CREATININE, SERUM
Creatinine, Ser: 1.07 mg/dL — ABNORMAL HIGH (ref 0.44–1.00)
GFR calc Af Amer: 56 mL/min — ABNORMAL LOW (ref >60)
GFR calc non Af Amer: 48 mL/min — ABNORMAL LOW (ref >60)

## 2019-11-30 SURGERY — HEMIARTHROPLASTY, HIP, DIRECT ANTERIOR APPROACH, FOR FRACTURE
Anesthesia: General | Site: Hip | Laterality: Left

## 2019-11-30 MED ORDER — HYDRALAZINE HCL 20 MG/ML IJ SOLN
5.0000 mg | INTRAMUSCULAR | Status: DC | PRN
  Administered 2019-11-30: 5 mg via INTRAVENOUS
  Filled 2019-11-30: qty 1

## 2019-11-30 MED ORDER — ROCURONIUM BROMIDE 10 MG/ML (PF) SYRINGE
PREFILLED_SYRINGE | INTRAVENOUS | Status: DC | PRN
  Administered 2019-11-30: 70 mg via INTRAVENOUS

## 2019-11-30 MED ORDER — TRANEXAMIC ACID 1000 MG/10ML IV SOLN
2000.0000 mg | INTRAVENOUS | Status: DC
  Filled 2019-11-30: qty 20

## 2019-11-30 MED ORDER — FENTANYL CITRATE (PF) 100 MCG/2ML IJ SOLN: 25.0000 ug | INTRAMUSCULAR | Status: DC | PRN

## 2019-11-30 MED ORDER — METHOCARBAMOL 500 MG PO TABS
500.0000 mg | ORAL_TABLET | Freq: Four times a day (QID) | ORAL | Status: DC | PRN
  Administered 2019-11-30 – 2019-12-01 (×2): 500 mg via ORAL
  Filled 2019-11-30 (×2): qty 1

## 2019-11-30 MED ORDER — ALBUMIN HUMAN 5 % IV SOLN: INTRAVENOUS | Status: DC | PRN

## 2019-11-30 MED ORDER — ONDANSETRON HCL 4 MG PO TABS: 4.0000 mg | ORAL_TABLET | Freq: Four times a day (QID) | ORAL | Status: DC | PRN

## 2019-11-30 MED ORDER — MAGNESIUM CITRATE PO SOLN: 1.0000 | Freq: Once | ORAL | Status: DC | PRN

## 2019-11-30 MED ORDER — ESMOLOL HCL 100 MG/10ML IV SOLN
INTRAVENOUS | Status: DC | PRN
  Administered 2019-11-30: 20 mg via INTRAVENOUS

## 2019-11-30 MED ORDER — ENOXAPARIN SODIUM 40 MG/0.4ML ~~LOC~~ SOLN
40.0000 mg | SUBCUTANEOUS | Status: DC
  Administered 2019-12-01: 13:00:00 40 mg via SUBCUTANEOUS
  Filled 2019-11-30: qty 0.4

## 2019-11-30 MED ORDER — HYDROCODONE-ACETAMINOPHEN 5-325 MG PO TABS: 1.0000 | ORAL_TABLET | ORAL | Status: DC | PRN

## 2019-11-30 MED ORDER — TRANEXAMIC ACID-NACL 1000-0.7 MG/100ML-% IV SOLN
1000.0000 mg | INTRAVENOUS | Status: AC
  Administered 2019-11-30: 14:00:00 1000 mg via INTRAVENOUS

## 2019-11-30 MED ORDER — ENOXAPARIN SODIUM 40 MG/0.4ML ~~LOC~~ SOLN: 40.0000 mg | Freq: Every day | SUBCUTANEOUS | 13 refills | Status: AC

## 2019-11-30 MED ORDER — LIDOCAINE 2% (20 MG/ML) 5 ML SYRINGE
INTRAMUSCULAR | Status: DC | PRN
  Administered 2019-11-30: 60 mg via INTRAVENOUS
  Administered 2019-11-30: 4 mg via INTRAVENOUS

## 2019-11-30 MED ORDER — SERTRALINE HCL 50 MG PO TABS
25.0000 mg | ORAL_TABLET | Freq: Every day | ORAL | Status: DC
  Administered 2019-11-30 – 2019-12-02 (×3): 25 mg via ORAL
  Filled 2019-11-30 (×3): qty 1

## 2019-11-30 MED ORDER — ALUM & MAG HYDROXIDE-SIMETH 200-200-20 MG/5ML PO SUSP: 30.0000 mL | ORAL | Status: DC | PRN

## 2019-11-30 MED ORDER — SORBITOL 70 % SOLN: 30.0000 mL | Freq: Every day | Status: DC | PRN

## 2019-11-30 MED ORDER — CEFAZOLIN SODIUM-DEXTROSE 2-4 GM/100ML-% IV SOLN
2.0000 g | INTRAVENOUS | Status: AC
  Administered 2019-11-30: 2 g via INTRAVENOUS

## 2019-11-30 MED ORDER — ONDANSETRON HCL 4 MG/2ML IJ SOLN: 4.0000 mg | Freq: Four times a day (QID) | INTRAMUSCULAR | Status: DC | PRN

## 2019-11-30 MED ORDER — OXYCODONE-ACETAMINOPHEN 5-325 MG PO TABS: 1.0000 | ORAL_TABLET | Freq: Three times a day (TID) | ORAL | 0 refills | Status: AC | PRN

## 2019-11-30 MED ORDER — MORPHINE SULFATE (PF) 2 MG/ML IV SOLN
1.0000 mg | INTRAVENOUS | Status: DC | PRN
  Administered 2019-12-01: 01:00:00 1 mg via INTRAVENOUS
  Filled 2019-11-30: qty 1

## 2019-11-30 MED ORDER — ACETAMINOPHEN 10 MG/ML IV SOLN: 1000.0000 mg | Freq: Once | INTRAVENOUS | Status: AC

## 2019-11-30 MED ORDER — DOCUSATE SODIUM 100 MG PO CAPS
100.0000 mg | ORAL_CAPSULE | Freq: Two times a day (BID) | ORAL | Status: DC
  Administered 2019-11-30 – 2019-12-02 (×4): 100 mg via ORAL
  Filled 2019-11-30 (×4): qty 1

## 2019-11-30 MED ORDER — SENNOSIDES-DOCUSATE SODIUM 8.6-50 MG PO TABS
1.0000 | ORAL_TABLET | Freq: Every day | ORAL | Status: DC
  Administered 2019-11-30 – 2019-12-01 (×3): 1 via ORAL
  Filled 2019-11-30 (×3): qty 1

## 2019-11-30 MED ORDER — TRANEXAMIC ACID-NACL 1000-0.7 MG/100ML-% IV SOLN
INTRAVENOUS | Status: AC
  Filled 2019-11-30: qty 100

## 2019-11-30 MED ORDER — FENTANYL CITRATE (PF) 250 MCG/5ML IJ SOLN
INTRAMUSCULAR | Status: AC
  Filled 2019-11-30: qty 5

## 2019-11-30 MED ORDER — TRANEXAMIC ACID 1000 MG/10ML IV SOLN
INTRAVENOUS | Status: DC | PRN
  Administered 2019-11-30: 2000 mg via TOPICAL

## 2019-11-30 MED ORDER — POVIDONE-IODINE 10 % EX SWAB: 2.0000 "application " | Freq: Once | CUTANEOUS | Status: DC

## 2019-11-30 MED ORDER — HYDROCODONE-ACETAMINOPHEN 7.5-325 MG PO TABS
1.0000 | ORAL_TABLET | ORAL | Status: DC | PRN
  Administered 2019-11-30: 21:00:00 1 via ORAL
  Administered 2019-12-01: 2 via ORAL
  Filled 2019-11-30: qty 2
  Filled 2019-11-30: qty 1

## 2019-11-30 MED ORDER — DEXAMETHASONE SODIUM PHOSPHATE 10 MG/ML IJ SOLN
INTRAMUSCULAR | Status: DC | PRN
  Administered 2019-11-30: 4 mg via INTRAVENOUS

## 2019-11-30 MED ORDER — HYDROCODONE-ACETAMINOPHEN 7.5-325 MG PO TABS: 1.0000 | ORAL_TABLET | Freq: Three times a day (TID) | ORAL | 0 refills | Status: DC | PRN

## 2019-11-30 MED ORDER — FENTANYL CITRATE (PF) 250 MCG/5ML IJ SOLN
INTRAMUSCULAR | Status: DC | PRN
  Administered 2019-11-30 (×2): 50 ug via INTRAVENOUS
  Administered 2019-11-30: 100 ug via INTRAVENOUS

## 2019-11-30 MED ORDER — HYDRALAZINE HCL 20 MG/ML IJ SOLN
INTRAMUSCULAR | Status: AC
  Filled 2019-11-30: qty 1

## 2019-11-30 MED ORDER — SODIUM CHLORIDE 0.9 % IR SOLN
Status: DC | PRN
  Administered 2019-11-30: 3000 mL

## 2019-11-30 MED ORDER — ENOXAPARIN SODIUM 40 MG/0.4ML ~~LOC~~ SOLN: 40.0000 mg | Freq: Every day | SUBCUTANEOUS | 13 refills | Status: DC

## 2019-11-30 MED ORDER — EPHEDRINE SULFATE 50 MG/ML IJ SOLN
INTRAMUSCULAR | Status: DC | PRN
  Administered 2019-11-30: 5 mg via INTRAVENOUS
  Administered 2019-11-30: 10 mg via INTRAVENOUS
  Administered 2019-11-30: 5 mg via INTRAVENOUS
  Administered 2019-11-30: 10 mg via INTRAVENOUS

## 2019-11-30 MED ORDER — ACETAMINOPHEN 10 MG/ML IV SOLN
INTRAVENOUS | Status: AC
  Administered 2019-11-30: 1000 mg via INTRAVENOUS
  Filled 2019-11-30: qty 100

## 2019-11-30 MED ORDER — ACETAMINOPHEN 325 MG PO TABS: 325.0000 mg | ORAL_TABLET | Freq: Four times a day (QID) | ORAL | Status: DC | PRN

## 2019-11-30 MED ORDER — PHENYLEPHRINE HCL-NACL 10-0.9 MG/250ML-% IV SOLN
INTRAVENOUS | Status: DC | PRN
  Administered 2019-11-30: 30 ug/min via INTRAVENOUS

## 2019-11-30 MED ORDER — MORPHINE SULFATE (PF) 2 MG/ML IV SOLN
0.5000 mg | INTRAVENOUS | Status: DC | PRN
  Administered 2019-11-30 (×4): 0.5 mg via INTRAVENOUS
  Filled 2019-11-30 (×4): qty 1

## 2019-11-30 MED ORDER — CEFAZOLIN SODIUM-DEXTROSE 2-4 GM/100ML-% IV SOLN
INTRAVENOUS | Status: AC
  Filled 2019-11-30: qty 100

## 2019-11-30 MED ORDER — POLYETHYLENE GLYCOL 3350 17 G PO PACK: 17.0000 g | PACK | Freq: Every day | ORAL | Status: DC | PRN

## 2019-11-30 MED ORDER — PSYLLIUM 95 % PO PACK
1.0000 | PACK | Freq: Every day | ORAL | Status: DC
  Administered 2019-11-30 – 2019-12-01 (×2): 1 via ORAL
  Filled 2019-11-30 (×2): qty 1

## 2019-11-30 MED ORDER — PROPOFOL 10 MG/ML IV BOLUS
INTRAVENOUS | Status: AC
  Filled 2019-11-30: qty 20

## 2019-11-30 MED ORDER — ENSURE PRE-SURGERY PO LIQD: 296.0000 mL | Freq: Once | ORAL | Status: DC

## 2019-11-30 MED ORDER — PROPOFOL 10 MG/ML IV BOLUS
INTRAVENOUS | Status: DC | PRN
  Administered 2019-11-30: 40 mg via INTRAVENOUS
  Administered 2019-11-30: 110 mg via INTRAVENOUS

## 2019-11-30 MED ORDER — MENTHOL 3 MG MT LOZG: 1.0000 | LOZENGE | OROMUCOSAL | Status: DC | PRN

## 2019-11-30 MED ORDER — SUGAMMADEX SODIUM 200 MG/2ML IV SOLN
INTRAVENOUS | Status: DC | PRN
  Administered 2019-11-30: 200 mg via INTRAVENOUS

## 2019-11-30 MED ORDER — LORAZEPAM 1 MG PO TABS
1.0000 mg | ORAL_TABLET | Freq: Three times a day (TID) | ORAL | Status: DC
  Administered 2019-11-30 – 2019-12-02 (×6): 1 mg via ORAL
  Filled 2019-11-30 (×2): qty 2
  Filled 2019-11-30: qty 1
  Filled 2019-11-30 (×3): qty 2

## 2019-11-30 MED ORDER — METHOCARBAMOL 1000 MG/10ML IJ SOLN
500.0000 mg | Freq: Four times a day (QID) | INTRAVENOUS | Status: DC | PRN
  Filled 2019-11-30: qty 5

## 2019-11-30 MED ORDER — SODIUM CHLORIDE 0.9 % IV SOLN
INTRAVENOUS | Status: DC | PRN
  Administered 2019-11-30 (×2): 20 mL

## 2019-11-30 MED ORDER — 0.9 % SODIUM CHLORIDE (POUR BTL) OPTIME
TOPICAL | Status: DC | PRN
  Administered 2019-11-30: 1000 mL

## 2019-11-30 MED ORDER — LACTATED RINGERS IV SOLN: INTRAVENOUS | Status: DC

## 2019-11-30 MED ORDER — BUPIVACAINE HCL (PF) 0.25 % IJ SOLN
INTRAMUSCULAR | Status: AC
  Filled 2019-11-30: qty 30

## 2019-11-30 MED ORDER — SODIUM CHLORIDE 0.9% FLUSH
INTRAVENOUS | Status: DC | PRN
  Administered 2019-11-30: 20 mL via TOPICAL

## 2019-11-30 MED ORDER — CEFAZOLIN SODIUM-DEXTROSE 2-4 GM/100ML-% IV SOLN
2.0000 g | Freq: Four times a day (QID) | INTRAVENOUS | Status: AC
  Administered 2019-11-30 – 2019-12-01 (×3): 2 g via INTRAVENOUS
  Filled 2019-11-30 (×3): qty 100

## 2019-11-30 MED ORDER — PHENOL 1.4 % MT LIQD: 1.0000 | OROMUCOSAL | Status: DC | PRN

## 2019-11-30 MED ORDER — VANCOMYCIN HCL 1000 MG IV SOLR
INTRAVENOUS | Status: AC
  Filled 2019-11-30: qty 1000

## 2019-11-30 MED ORDER — SODIUM CHLORIDE 0.9 % IV SOLN: INTRAVENOUS | Status: DC

## 2019-11-30 MED ORDER — ONDANSETRON HCL 4 MG/2ML IJ SOLN
INTRAMUSCULAR | Status: DC | PRN
  Administered 2019-11-30: 4 mg via INTRAVENOUS

## 2019-11-30 MED ORDER — VANCOMYCIN HCL 1 G IV SOLR
INTRAVENOUS | Status: DC | PRN
  Administered 2019-11-30: 1000 mg

## 2019-11-30 MED ORDER — ACETAMINOPHEN 500 MG PO TABS
500.0000 mg | ORAL_TABLET | Freq: Four times a day (QID) | ORAL | Status: AC
  Administered 2019-11-30 – 2019-12-01 (×4): 500 mg via ORAL
  Filled 2019-11-30 (×4): qty 1

## 2019-11-30 SURGICAL SUPPLY — 49 items
BAG DECANTER FOR FLEXI CONT (MISCELLANEOUS) ×2 IMPLANT
BIPOLAR PROS AML 46 (Hips) ×2 IMPLANT
CELLS DAT CNTRL 66122 CELL SVR (MISCELLANEOUS) IMPLANT
COVER PERINEAL POST (MISCELLANEOUS) ×2 IMPLANT
COVER SURGICAL LIGHT HANDLE (MISCELLANEOUS) ×2 IMPLANT
DERMABOND ADHESIVE PROPEN (GAUZE/BANDAGES/DRESSINGS) ×1
DERMABOND ADVANCED .7 DNX6 (GAUZE/BANDAGES/DRESSINGS) ×1 IMPLANT
DRAPE C-ARM 42X72 X-RAY (DRAPES) ×2 IMPLANT
DRAPE POUCH INSTRU U-SHP 10X18 (DRAPES) ×2 IMPLANT
DRAPE STERI IOBAN 125X83 (DRAPES) ×2 IMPLANT
DRAPE U-SHAPE 47X51 STRL (DRAPES) ×4 IMPLANT
DRSG AQUACEL AG ADV 3.5X10 (GAUZE/BANDAGES/DRESSINGS) ×2 IMPLANT
DURAPREP 26ML APPLICATOR (WOUND CARE) ×4 IMPLANT
ELECT BLADE 4.0 EZ CLEAN MEGAD (MISCELLANEOUS) ×2
ELECT REM PT RETURN 9FT ADLT (ELECTROSURGICAL) ×2
ELECTRODE BLDE 4.0 EZ CLN MEGD (MISCELLANEOUS) ×1 IMPLANT
ELECTRODE REM PT RTRN 9FT ADLT (ELECTROSURGICAL) ×1 IMPLANT
GLOVE BIOGEL PI IND STRL 7.0 (GLOVE) ×1 IMPLANT
GLOVE BIOGEL PI INDICATOR 7.0 (GLOVE) ×1
GLOVE ECLIPSE 7.0 STRL STRAW (GLOVE) ×4 IMPLANT
GLOVE SKINSENSE NS SZ7.5 (GLOVE) ×1
GLOVE SKINSENSE STRL SZ7.5 (GLOVE) ×1 IMPLANT
GLOVE SURG SYN 7.5  E (GLOVE) ×4
GLOVE SURG SYN 7.5 E (GLOVE) ×4 IMPLANT
GOWN STRL REIN XL XLG (GOWN DISPOSABLE) ×2 IMPLANT
GOWN STRL REUS W/ TWL LRG LVL3 (GOWN DISPOSABLE) IMPLANT
GOWN STRL REUS W/ TWL XL LVL3 (GOWN DISPOSABLE) ×1 IMPLANT
GOWN STRL REUS W/TWL LRG LVL3 (GOWN DISPOSABLE)
GOWN STRL REUS W/TWL XL LVL3 (GOWN DISPOSABLE) ×1
HANDPIECE INTERPULSE COAX TIP (DISPOSABLE) ×1
HEAD BIPOLAR PROS AML 46 (Hips) ×1 IMPLANT
HEAD FEM STD 28X+1.5 STRL (Hips) ×2 IMPLANT
HOOD PEEL AWAY FLYTE STAYCOOL (MISCELLANEOUS) ×4 IMPLANT
IV NS IRRIG 3000ML ARTHROMATIC (IV SOLUTION) ×2 IMPLANT
KIT BASIN OR (CUSTOM PROCEDURE TRAY) ×2 IMPLANT
MARKER SKIN DUAL TIP RULER LAB (MISCELLANEOUS) ×2 IMPLANT
NEEDLE SPNL 18GX3.5 QUINCKE PK (NEEDLE) ×2 IMPLANT
PACK TOTAL JOINT (CUSTOM PROCEDURE TRAY) ×2 IMPLANT
PACK UNIVERSAL I (CUSTOM PROCEDURE TRAY) ×2 IMPLANT
RTRCTR WOUND ALEXIS 18CM MED (MISCELLANEOUS)
SAW OSC TIP CART 19.5X105X1.3 (SAW) ×2 IMPLANT
SET HNDPC FAN SPRY TIP SCT (DISPOSABLE) ×1 IMPLANT
STEM CORAIL KA14 (Stem) ×2 IMPLANT
SUT ETHIBOND 2 OS 4 DA (SUTURE) ×2 IMPLANT
SUT VIC AB 2-0 CT1 27 (SUTURE) ×3
SUT VIC AB 2-0 CT1 TAPERPNT 27 (SUTURE) ×3 IMPLANT
SYR 50ML LL SCALE MARK (SYRINGE) ×2 IMPLANT
TOWEL GREEN STERILE (TOWEL DISPOSABLE) ×2 IMPLANT
YANKAUER SUCT BULB TIP NO VENT (SUCTIONS) ×2 IMPLANT

## 2019-11-30 NOTE — Consult Note (Signed)
ORTHOPAEDIC CONSULTATION  REQUESTING PHYSICIAN: Hosie Poisson, MD  Chief Complaint: Left femoral neck fracture  HPI: Kari Colon is a 85 y.o. female who presented with a left femoral neck fracture after she slipped out of her wheelchair and landed on her back at her living facility.  She had a right femoral neck fracture that was repaired in 2016.  She denies hitting her head or losing consciousness.  Denies any chest pain or shortness of breath.  She has a history of diabetes and factor V Leiden not on medications.  She is in severe pain due to the left hip fracture.  Orthopedics consulted.  Past Medical History:  Diagnosis Date  . Aftercare for healing traumatic fracture of hip   . Carotid bruit   . Chronic rhinitis   . Depressive disorder, not elsewhere classified   . Diabetes mellitus without complication ([place])   . Diverticulitis   . Duodenal ulcer   . Factor V deficiency ([place])   . Fibrocystic breast disease   . Fibromuscular dysplasia ([place])   . Generalized pain   . GERD (gastroesophageal reflux disease)   . Heart murmur   . Hyperlipidemia   . 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 Insomnia, unspecified   . Macular degeneration   . Mesenteric ischemia ([place])   . Other abnormal glucose   . Other specified rehabilitation procedure(V57.89)   . Personal history of fall   . Pulmonary HTN ([place])   . Reflux esophagitis   . Renal artery stenosis ([place])   . Subclavian arterial stenosis ([place])   . Tricuspid regurgitation   . Tubular adenoma of colon   . Unspecified constipation   . Unspecified essential hypertension   . Vitamin D deficiency    Past Surgical History:  Procedure Laterality Date  . AORTA - SUPERIOR MESENTERIC AND AORTA - RENAL ARTERY BYPASS GRAFT    . APPENDECTOMY    . CHOLECYSTECTOMY    . DILATION AND CURETTAGE OF UTERUS    . HIP SURGERY    . RENAL ARTERY BYPASS    . TONSILLECTOMY     Social History   Socioeconomic History  . Marital  status: Unknown    Spouse name: Not on file  . Number of children: Not on file  . Years of education: Not on file  . Highest education level: Not on file  Occupational History  . Not on file  Tobacco Use  . Smoking status: Current Every Day Smoker    Packs/day: 1.00    Types: Cigarettes  . Smokeless tobacco: Former Network engineer and Sexual Activity  . Alcohol use: No  . Drug use: No  . Sexual activity: Not on file  Other Topics Concern  . Not on file  Social History Narrative  . Not on file   Social Determinants of Health   Financial Resource Strain:   . Difficulty of Paying Living Expenses:   Food Insecurity:   . Worried About Charity fundraiser in the Last Year:   . Arboriculturist in the Last Year:   Transportation Needs:   . Film/video editor (Medical):   Marland Kitchen Lack of Transportation (Non-Medical):   Physical Activity:   . Days of Exercise per Week:   . Minutes of Exercise per Session:   Stress:   . Feeling of Stress :   Social Connections:   . Frequency of Communication with Friends and Family:   . Frequency of Social Gatherings with Friends and  Family:   . Attends Religious Services:   . Active Member of Clubs or Organizations:   . Attends Archivist Meetings:   Marland Kitchen Marital Status:    Family History  Problem Relation Age of Onset  . Diabetes Mellitus II Maternal Grandfather    - negative except otherwise stated in the family history section Allergies  Allergen Reactions  . Mirapex [Pramipexole Dihydrochloride] Other (See Comments)    insomnia  . Nexium [Esomeprazole Magnesium] Other (See Comments)    Ache in chest  . Prednisone Nausea And Vomiting  . Prevacid [Lansoprazole] Other (See Comments)    Ache in chest  . Ticlid [Ticlopidine] Hives  . Wellbutrin [Bupropion] Hives and Other (See Comments)    lightheadedness  . Zantac [Ranitidine Hcl] Hives  . Amoxil [Amoxicillin] Rash  . Effexor [Venlafaxine] Rash  . Penicillins Rash    Has  patient had a PCN reaction causing immediate rash, facial/tongue/throat swelling, SOB or lightheadedness with hypotension: Yes Has patient had a PCN reaction causing severe rash involving mucus membranes or skin necrosis: No Has patient had a PCN reaction that required hospitalization: No Has patient had a PCN reaction occurring within the last 10 years: No If all of the above answers are "NO", then may proceed with Cephalosporin use.   Prior to Admission medications   Medication Sig Start Date End Date Taking? Authorizing Provider  acetaminophen (TYLENOL) 650 MG CR tablet Take 650 mg by mouth in the morning, at noon, and at bedtime.    Yes [provider]  LORazepam (ATIVAN) 1 MG tablet Take 1 mg by mouth 3 (three) times daily. 10/30/19  Yes [provider]  methadone (DOLOPHINE) 5 MG tablet Take 2.5 mg by mouth as directed.  11/06/19  Yes [provider]  psyllium (REGULOID) 0.52 g capsule Take 0.52 g by mouth at bedtime.   Yes [provider]  sennosides-docusate sodium (SENOKOT-S) 8.6-50 MG tablet Take 1 tablet by mouth at bedtime.   Yes [provider]  sertraline (ZOLOFT) 25 MG tablet Take 25 mg by mouth daily. 10/14/19  Yes [provider]  cephALEXin (KEFLEX) 500 MG capsule Take 1 capsule (500 mg total) by mouth every 12 (twelve) hours. Patient not taking: Reported on 11/29/2019 01/01/18   Geradine Girt, DO  docusate sodium (COLACE) 100 MG capsule Take 1 capsule (100 mg total) by mouth 2 (two) times daily. Patient not taking: Reported on 11/29/2019 01/01/18   Geradine Girt, DO  enoxaparin (LOVENOX) 40 MG/0.4ML injection Inject 0.4 mLs (40 mg total) into the skin daily. 11/30/19   Leandrew Koyanagi, MD  oxyCODONE-acetaminophen (PERCOCET) 5-325 MG tablet Take 1-2 tablets by mouth every 8 (eight) hours as needed for severe pain. 11/30/19   Leandrew Koyanagi, MD  polyethylene glycol (MIRALAX / Floria Raveling) packet Take 17 g by mouth daily as needed for mild  constipation. Patient not taking: Reported on 11/29/2019 01/01/18   Geradine Girt, DO  traMADol (ULTRAM) 50 MG tablet Take 2 tablets (100 mg total) by mouth every 8 (eight) hours as needed (pain). Patient not taking: Reported on 11/29/2019 01/01/18   Geradine Girt, DO   Pelvis Portable  Result Date: 11/30/2019 CLINICAL DATA:  Postop right hip replacement is the given clinical information. EXAM: PORTABLE PELVIS 1-2 VIEWS COMPARISON:  11/29/2019 FINDINGS: Bipolar hip replacement performed on the left for treatment of femoral neck fracture. Older hip arthroplasty on the right with cerclage wire. IMPRESSION: Interval bipolar hip replacement on the  left. Electronically Signed   By: Nelson Chimes M.D.   On: 11/30/2019 16:14   DG Chest Port 1 View  Result Date: 11/29/2019 CLINICAL DATA:  Left hip fracture, preoperative evaluation, hypertension, tobacco abuse EXAM: PORTABLE CHEST 1 VIEW COMPARISON:  02/03/2018 FINDINGS: Single frontal view of the chest demonstrates an unremarkable cardiac silhouette. Minimal atherosclerosis within the aortic arch. No airspace disease, effusion, or pneumothorax. No acute bony abnormality. IMPRESSION: 1. No acute intrathoracic process. Electronically Signed   By: Randa Ngo M.D.   On: 11/29/2019 20:13   DG C-Arm 1-60 Min  Result Date: 11/30/2019 CLINICAL DATA:  85 year old female undergoing left hip replacement. EXAM: OPERATIVE left HIP (WITH PELVIS IF PERFORMED) 2 VIEWS TECHNIQUE: Fluoroscopic spot image(s) were submitted for interpretation post-operatively. COMPARISON:  CT Abdomen and Pelvis 10/30/2017. FLUOROSCOPY TIME:  0 minutes 6 seconds FINDINGS: 2 intraoperative fluoroscopic AP spot views of the left hip and lower pelvis. New left hip arthroplasty hardware appears intact with normal AP alignment. No unexpected osseous changes identified. Pre-existing right hip arthroplasty. IMPRESSION: Intraoperative images of left hip arthroplasty with no adverse features.  Electronically Signed   By: Genevie Ann M.D.   On: 11/30/2019 16:20   DG HIP OPERATIVE UNILAT W OR W/O PELVIS LEFT  Result Date: 11/30/2019 CLINICAL DATA:  85 year old female undergoing left hip replacement. EXAM: OPERATIVE left HIP (WITH PELVIS IF PERFORMED) 2 VIEWS TECHNIQUE: Fluoroscopic spot image(s) were submitted for interpretation post-operatively. COMPARISON:  CT Abdomen and Pelvis 10/30/2017. FLUOROSCOPY TIME:  0 minutes 6 seconds FINDINGS: 2 intraoperative fluoroscopic AP spot views of the left hip and lower pelvis. New left hip arthroplasty hardware appears intact with normal AP alignment. No unexpected osseous changes identified. Pre-existing right hip arthroplasty. IMPRESSION: Intraoperative images of left hip arthroplasty with no adverse features. Electronically Signed   By: Genevie Ann M.D.   On: 11/30/2019 16:20   DG Hip Unilat W or Wo Pelvis 2-3 Views Left  Result Date: 11/29/2019 CLINICAL DATA:  85 year old female with fall and left hip pain. EXAM: DG HIP (WITH OR WITHOUT PELVIS) 2-3V LEFT COMPARISON:  None. FINDINGS: There is a mildly displaced fracture of the left femoral neck. No dislocation. There is a right hip arthroplasty. The bones are osteopenic. The soft tissues are unremarkable. A pessary is noted over the pelvis. IMPRESSION: Mildly displaced fracture of the left femoral neck. No dislocation. Electronically Signed   By: Anner Crete M.D.   On: 11/29/2019 19:07   - pertinent xrays, CT, MRI studies were reviewed and independently interpreted  Positive ROS: All other systems have been reviewed and were otherwise negative with the exception of those mentioned in the HPI and as above.  Physical Exam: General: No acute distress Cardiovascular: No pedal edema Respiratory: No cyanosis, no use of accessory musculature GI: No organomegaly, abdomen is soft and non-tender Skin: No lesions in the area of chief complaint Neurologic: Sensation intact distally Psychiatric: Patient is at  baseline mood and affect Lymphatic: No axillary or cervical lymphadenopathy  MUSCULOSKELETAL:  Minimal movement of the left leg secondary to pain and guarding. Neurovascular intact distally.  Assessment: Left femoral neck fracture  Plan: Recommendation is for hemiarthroplasty for pain relief and early mobilization with PT.  Risk benefits alternatives rehab recovery all reviewed with the patient's daughter who is in agreement to proceed.  Thank you for the consult and the opportunity to see Ms. Okimoto  N. Eduard Roux, MD [facility] 6:09 PM

## 2019-11-30 NOTE — Progress Notes (Signed)
85 year old lady with prior h/o hypertension, DM, factor V deficiency, vit B12 deficiency, depression presents to ED after a fall from the wheelchair. Pt denies any loss of consciousness.  On arrival to ED, x rays show left hip fracture. Orthopedics consulted and she is scheduled for surgery today.    Pt seen and examined at bedside.  Orthopedics consulted and recommendations given.    Hosie Poisson, MD

## 2019-11-30 NOTE — Op Note (Signed)
ANTERIOR APPROACH HEMI HIP ARTHROPLASTY  Procedure Note Aye Wilker   [id number]  Pre-op Diagnosis: Fractured Left Hip     Post-op Diagnosis: same   Operative Procedures  1. Prosthetic replacement for femoral neck fracture. CPT [phone]  Personnel  Surgeon(s): Leandrew Koyanagi, MD  ASSIST: Madalyn Rob, PA-C; necessary for the timely completion of procedure and due to complexity of procedure.   Anesthesia: general  Prosthesis: Depuy  Femur: Corail KA 14 Head: 46 mm size: +1.5 Bearing Type: bipolar  Hip Hemiarthroplasty (Anterior Approach) Op Note:  After informed consent was obtained and the operative extremity marked in the holding area, the patient was brought back to the operating room and placed supine on the HANA table. Next, the operative extremity was prepped and draped in normal sterile fashion. Surgical timeout occurred verifying patient identification, surgical site, surgical procedure and administration of antibiotics.  A modified anterior Smith-Peterson approach to the hip was performed, using the interval between tensor fascia lata and sartorius.  Dissection was carried bluntly down onto the anterior hip capsule. The lateral femoral circumflex vessels were identified and coagulated. A capsulotomy was performed and the capsular flaps tagged for later repair.  The neck osteotomy was performed. The femoral head was removed and found a 46 mm head was the appropriate fit.    We then turned our attention to the femur.  After placing the femoral hook, the leg was taken to externally rotated, extended and adducted position taking care to perform soft tissue releases to allow for adequate mobilization of the femur. Soft tissue was cleared from the shoulder of the greater trochanter and the hook elevator used to improve exposure of the proximal femur. Sequential broaching performed up to a size 14. Trial neck and head were placed. The leg was brought back up to neutral and the  construct reduced. The position and sizing of components, offset and leg lengths were checked using fluoroscopy. Stability of the construct was checked in extension and external rotation without any subluxation or impingement of prosthesis. We dislocated the prosthesis, dropped the leg back into position, removed trial components, and irrigated copiously. The final stem and head was then placed, the leg brought back up, the system reduced and fluoroscopy used to verify positioning.  We irrigated, obtained hemostasis and closed the capsule using #2 ethibond suture.  The fascia was closed with #1 vicryl plus, the deep fat layer was closed with 0 vicryl, the subcutaneous layers closed with 2.0 Vicryl Plus and the skin closed with staples. A sterile dressing was applied. The patient was awakened in the operating room and taken to recovery in stable condition. All sponge, needle, and instrument counts were correct at the end of the case.   Position: supine  Complications: see description of procedure.  Time Out: performed   Drains/Packing: none  Estimated blood loss: see anesthesia record  Returned to Recovery Room: in good condition.   Antibiotics: yes   Mechanical VTE (DVT) Prophylaxis: sequential compression devices, TED thigh-high  Chemical VTE (DVT) Prophylaxis: lovenox  Fluid Replacement: Crystalloid: see anesthesia record  Specimens Removed: 1 to pathology   Sponge and Instrument Count Correct? yes   PACU: portable radiograph - low AP   Admission: inpatient status, start PT & OT POD#1  Plan/RTC: Return in 2 weeks for staple removal. Return in 6 weeks to see MD.  Weight Bearing/Load Lower Extremity: full  Hip precautions: none  N. Eduard Roux, MD [facility] 3:15 PM   Implant Name Type  Inv. Item Serial No. Manufacturer Lot No. LRB No. Used Action  BIPOLAR PROS AML 46MM - [id number] Hips BIPOLAR PROS AML 46MM  DEPUY SYNTHES [id number] Left 1 Implanted  STEM CORAIL KA14 -  [id number] Stem STEM CORAIL KA14  DEPUY SYNTHES [id number] Left 1 Implanted  HIP BALL ARTICU DEPUY - [id number] Hips HIP BALL ARTICU DEPUY  DEPUY SYNTHES [id number] Left 1 Implanted

## 2019-11-30 NOTE — ED Notes (Signed)
Pt continuously calling out for pain medications. Provider paged as there are no prn orders at this time

## 2019-11-30 NOTE — Discharge Instructions (Signed)
° ° °  1. Change dressings as needed °2. May shower but keep incisions covered and dry °3. Take lovenox to prevent blood clots °4. Take stool softeners as needed °5. Take pain meds as needed ° °

## 2019-11-30 NOTE — Anesthesia Procedure Notes (Signed)
Procedure Name: Intubation Date/Time: 11/30/2019 2:02 PM Performed by: Wilburn Cornelia, CRNA Pre-anesthesia Checklist: Patient identified, Emergency Drugs available, Suction available, Patient being monitored and Timeout performed Patient Re-evaluated:Patient Re-evaluated prior to induction Oxygen Delivery Method: Circle system utilized Preoxygenation: Pre-oxygenation with 100% oxygen Induction Type: IV induction Ventilation: Mask ventilation without difficulty Laryngoscope Size: Mac and 3 Grade View: Grade I Tube type: Oral Tube size: 7.0 mm Number of attempts: 1 Airway Equipment and Method: Stylet Placement Confirmation: ETT inserted through vocal cords under direct vision,  positive ETCO2,  CO2 detector and breath sounds checked- equal and bilateral Secured at: 21 cm Tube secured with: Tape Dental Injury: Teeth and Oropharynx as per pre-operative assessment

## 2019-11-30 NOTE — Progress Notes (Addendum)
Patient pulled out her foley catheter out and trying to get out of bed, catheter tubing is intact, no bleeding noted.  Reoriented patient, bed alarm back on and provided activity apron to play withy . Will closely monitor.

## 2019-11-30 NOTE — H&P (Signed)
History and Physical    [name] QN:[id number] DOB: Jul 02, 1935 DOA: 11/29/2019  PCP: Orvis Brill, [facility]  Patient coming from: [facility].  Chief Complaint: [place] out of the wheelchair.  HPI: Kari Colon is a 85 y.o. female with history of depression B12 deficiency diabetes mellitus presently not on medication factor V deficiency and hypertension present not on medication presents to the ER after patient had a fall at the living facility.  Patient states she slipped of the wheelchair and landed on her back.  Denies hitting her head or losing consciousness.  Denies any chest pain or shortness of breath.  ED Course: In the ER patient was afebrile and not hypoxic.  EKG shows normal sinus rhythm with nonspecific ST-T changes.  X-ray showed left hip fracture and on call orthopedic surgeon Dr. Erlinda Hong was consulted and patient is being admitted for further management of hip fracture.  Labs reveal creatinine 1 blood glucose 138 WBC 12.3 Covid test negative.  Chest x-ray unremarkable.  Review of Systems: As per HPI, rest all negative.   Past Medical History:  Diagnosis Date  . Aftercare for healing traumatic fracture of hip   . Carotid bruit   . Chronic rhinitis   . Depressive disorder, not elsewhere classified   . Diabetes mellitus without complication ([place])   . Diverticulitis   . Duodenal ulcer   . Factor V deficiency ([place])   . Fibrocystic breast disease   . Fibromuscular dysplasia ([place])   . Generalized pain   . GERD (gastroesophageal reflux disease)   . Heart murmur   . Hyperlipidemia   . 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 Insomnia, unspecified   . Macular degeneration   . Mesenteric ischemia ([place])   . Other abnormal glucose   . Other specified rehabilitation procedure(V57.89)   . Personal history of fall   . Pulmonary HTN ([place])   . Reflux esophagitis   . Renal artery stenosis ([place])   . Subclavian arterial stenosis ([place])   . Tricuspid  regurgitation   . Tubular adenoma of colon   . Unspecified constipation   . Unspecified essential hypertension   . Vitamin D deficiency     Past Surgical History:  Procedure Laterality Date  . AORTA - SUPERIOR MESENTERIC AND AORTA - RENAL ARTERY BYPASS GRAFT    . APPENDECTOMY    . CHOLECYSTECTOMY    . DILATION AND CURETTAGE OF UTERUS    . HIP SURGERY    . RENAL ARTERY BYPASS    . TONSILLECTOMY       reports that she has been smoking cigarettes. She has been smoking about 1.00 pack per day. She has quit using smokeless tobacco. She reports that she does not drink alcohol or use drugs.  Allergies  Allergen Reactions  . Mirapex [Pramipexole Dihydrochloride] Other (See Comments)    insomnia  . Nexium [Esomeprazole Magnesium] Other (See Comments)    Ache in chest  . Prednisone Nausea And Vomiting  . Prevacid [Lansoprazole] Other (See Comments)    Ache in chest  . Ticlid [Ticlopidine] Hives  . Wellbutrin [Bupropion] Hives and Other (See Comments)    lightheadedness  . Zantac [Ranitidine Hcl] Hives  . Amoxil [Amoxicillin] Rash  . Effexor [Venlafaxine] Rash  . Penicillins Rash    Has patient had a PCN reaction causing immediate rash, facial/tongue/throat swelling, SOB or lightheadedness with hypotension: Yes Has patient had a PCN reaction causing severe rash involving mucus membranes or skin necrosis: No Has patient had a  PCN reaction that required hospitalization: No Has patient had a PCN reaction occurring within the last 10 years: No If all of the above answers are "NO", then may proceed with Cephalosporin use.    Family History  Problem Relation Age of Onset  . Diabetes Mellitus II Maternal Grandfather     Prior to Admission medications   Medication Sig Start Date End Date Taking? Authorizing Provider  acetaminophen (TYLENOL) 650 MG CR tablet Take 650 mg by mouth in the morning, at noon, and at bedtime.    Yes [provider]  LORazepam (ATIVAN) 1 MG tablet  Take 1 mg by mouth 3 (three) times daily. 10/30/19  Yes [provider]  methadone (DOLOPHINE) 5 MG tablet Take 2.5 mg by mouth as directed.  11/06/19  Yes [provider]  psyllium (REGULOID) 0.52 g capsule Take 0.52 g by mouth at bedtime.   Yes [provider]  sennosides-docusate sodium (SENOKOT-S) 8.6-50 MG tablet Take 1 tablet by mouth at bedtime.   Yes [provider]  sertraline (ZOLOFT) 25 MG tablet Take 25 mg by mouth daily. 10/14/19  Yes [provider]  cephALEXin (KEFLEX) 500 MG capsule Take 1 capsule (500 mg total) by mouth every 12 (twelve) hours. Patient not taking: Reported on 11/29/2019 01/01/18   Geradine Girt, DO  docusate sodium (COLACE) 100 MG capsule Take 1 capsule (100 mg total) by mouth 2 (two) times daily. Patient not taking: Reported on 11/29/2019 01/01/18   Geradine Girt, DO  polyethylene glycol [facility] / GLYCOLAX) packet Take 17 g by mouth daily as needed for mild constipation. Patient not taking: Reported on 11/29/2019 01/01/18   Geradine Girt, DO  traMADol (ULTRAM) 50 MG tablet Take 2 tablets (100 mg total) by mouth every 8 (eight) hours as needed (pain). Patient not taking: Reported on 11/29/2019 01/01/18   Geradine Girt, DO    Physical Exam: Constitutional: Moderately built and nourished. Vitals:   11/29/19 2000 11/29/19 2030 11/29/19 2130 11/29/19 2230  BP: (!) 177/86 (!) 167/68 (!) 152/43 (!) 153/40  Pulse: 68 64 (!) 59 (!) 53  Resp: 13 19 18 12   Temp:      SpO2: 98% 97% 93% 94%   Eyes: Anicteric no pallor. ENMT: No discharge from the ears eyes nose or mouth. Neck: No mass felt.  No neck rigidity. Respiratory: No rhonchi or crepitations. Cardiovascular: S1-S2 heard. Abdomen: Soft nontender bowel sounds present. Musculoskeletal: Pain on moving left hip. Skin: No rash. Neurologic: Alert awake oriented to time place and person.  Moves all extremities. Psychiatric: Appears normal per normal affect.   Labs on  Admission: I have personally reviewed following labs and imaging studies  CBC: Recent Labs  Lab 11/29/19 1933  WBC 12.3*  NEUTROABS 9.8*  HGB 13.2  HCT 40.5  MCV 98.3  PLT [id number]   Basic Metabolic Panel: Recent Labs  Lab 11/29/19 1933  NA 135  K 4.2  CL 101  CO2 24  GLUCOSE 138*  BUN 21  CREATININE 1.03*  CALCIUM 8.9   GFR: CrCl cannot be calculated (Unknown ideal weight.). Liver Function Tests: Recent Labs  Lab 11/29/19 1933  AST 17  ALT 14  ALKPHOS 48  BILITOT 0.5  PROT 6.2*  ALBUMIN 3.8   No results for input(s): LIPASE, AMYLASE in the last 168 hours. No results for input(s): AMMONIA in the last 168 hours. Coagulation Profile: No results for input(s): INR, PROTIME in the last 168 hours. Cardiac Enzymes: No results  for input(s): CKTOTAL, CKMB, CKMBINDEX, TROPONINI in the last 168 hours. BNP (last 3 results) No results for input(s): PROBNP in the last 8760 hours. HbA1C: No results for input(s): HGBA1C in the last 72 hours. CBG: No results for input(s): GLUCAP in the last 168 hours. Lipid Profile: No results for input(s): CHOL, HDL, LDLCALC, TRIG, CHOLHDL, LDLDIRECT in the last 72 hours. Thyroid Function Tests: No results for input(s): TSH, T4TOTAL, FREET4, T3FREE, THYROIDAB in the last 72 hours. Anemia Panel: No results for input(s): VITAMINB12, FOLATE, FERRITIN, TIBC, IRON, RETICCTPCT in the last 72 hours. Urine analysis:    Component Value Date/Time   COLORURINE YELLOW 02/03/2018 1047   APPEARANCEUR CLEAR 02/03/2018 1047   LABSPEC 1.015 02/03/2018 1047   PHURINE 5.0 02/03/2018 1047   GLUCOSEU NEGATIVE 02/03/2018 1047   HGBUR NEGATIVE 02/03/2018 1047   [address] 02/03/2018 1047   KETONESUR NEGATIVE 02/03/2018 1047   PROTEINUR NEGATIVE 02/03/2018 1047   NITRITE NEGATIVE 02/03/2018 1047   LEUKOCYTESUR NEGATIVE 02/03/2018 1047   Sepsis Labs: @LABRCNTIP (procalcitonin:4,lacticidven:4) ) Recent Results (from the past 240 hour(s))    Respiratory Panel by RT PCR (Flu A&B, Covid) - Nasopharyngeal Swab     Status: None   Collection Time: 11/29/19  8:51 PM   Specimen: Nasopharyngeal Swab  Result Value Ref Range Status   SARS Coronavirus 2 by RT PCR NEGATIVE NEGATIVE Final    Comment: (NOTE) SARS-CoV-2 target nucleic acids are NOT DETECTED. The SARS-CoV-2 RNA is generally detectable in upper respiratoy specimens during the acute phase of infection. The lowest concentration of SARS-CoV-2 viral copies this assay can detect is 131 copies/mL. A negative result does not preclude SARS-Cov-2 infection and should not be used as the sole basis for treatment or other patient management decisions. A negative result may occur with  improper specimen collection/handling, submission of specimen other than nasopharyngeal swab, presence of viral mutation(s) within the areas targeted by this assay, and inadequate number of viral copies (<131 copies/mL). A negative result must be combined with clinical observations, patient history, and epidemiological information. The expected result is Negative. Fact Sheet for Patients:  PinkCheek.be Fact Sheet for Healthcare Providers:  GravelBags.it This test is not yet ap proved or cleared by the [place] FDA and  has been authorized for detection and/or diagnosis of SARS-CoV-2 by FDA under an Emergency Use Authorization (EUA). This EUA will remain  in effect (meaning this test can be used) for the duration of the COVID-19 declaration under Section 564(b)(1) of the Act, 21 U.S.C. section 360bbb-3(b)(1), unless the authorization is terminated or revoked sooner.    Influenza A by PCR NEGATIVE NEGATIVE Final   Influenza B by PCR NEGATIVE NEGATIVE Final    Comment: (NOTE) The Xpert Xpress SARS-CoV-2/FLU/RSV assay is intended as an aid in  the diagnosis of influenza from Nasopharyngeal swab specimens and  should not be used as a sole  basis for treatment. Nasal washings and  aspirates are unacceptable for Xpert Xpress SARS-CoV-2/FLU/RSV  testing. Fact Sheet for Patients: PinkCheek.be Fact Sheet for Healthcare Providers: GravelBags.it This test is not yet approved or cleared by the [place] FDA and  has been authorized for detection and/or diagnosis of SARS-CoV-2 by  FDA under an Emergency Use Authorization (EUA). This EUA will remain  in effect (meaning this test can be used) for the duration of the  Covid-19 declaration under Section 564(b)(1) of the Act, 21  U.S.C. section 360bbb-3(b)(1), unless the authorization is  terminated or revoked. Performed at [facility],  [place] [address], [place], [place] [address]      Radiological Exams on Admission: DG Chest Port 1 View  Result Date: 11/29/2019 CLINICAL DATA:  Left hip fracture, preoperative evaluation, hypertension, tobacco abuse EXAM: PORTABLE CHEST 1 VIEW COMPARISON:  02/03/2018 FINDINGS: Single frontal view of the chest demonstrates an unremarkable cardiac silhouette. Minimal atherosclerosis within the aortic arch. No airspace disease, effusion, or pneumothorax. No acute bony abnormality. IMPRESSION: 1. No acute intrathoracic process. Electronically Signed   By: Randa Ngo M.D.   On: 11/29/2019 20:13   DG Hip Unilat W or Wo Pelvis 2-3 Views Left  Result Date: 11/29/2019 CLINICAL DATA:  85 year old female with fall and left hip pain. EXAM: DG HIP (WITH OR WITHOUT PELVIS) 2-3V LEFT COMPARISON:  None. FINDINGS: There is a mildly displaced fracture of the left femoral neck. No dislocation. There is a right hip arthroplasty. The bones are osteopenic. The soft tissues are unremarkable. A pessary is noted over the pelvis. IMPRESSION: Mildly displaced fracture of the left femoral neck. No dislocation. Electronically Signed   By: Anner Crete M.D.   On: 11/29/2019 19:07    EKG:  Independently reviewed.  Normal sinus rhythm with nonspecific T changes.  Assessment/Plan Principal Problem:   Closed displaced fracture of left femoral neck (HCC) Active Problems:   Depression with anxiety   Factor V deficiency ([place])   Hip fracture ([place])    1. Left hip fracture status post mechanical fall for which orthopedic surgeon Dr. Erlinda Hong has been consulted patient will be kept n.p.o. past midnight.  Pain relief medications.  Further recommendations per orthopedics. 2. History of depression on Zoloft. 3. Previous history of hypertension and diabetes presently not on medication.  We will keep patient on as needed IV hydralazine and closely follow CBGs and metabolic panel. 4. Chronic pain on methadone. 5. History of B12 deficiency hemoglobin appears to be normal. 6. History of factor V [place] deficiency.  Since patient has fracture of the left hip will need more than 2 midnight stay and inpatient status.   DVT prophylaxis: SCDs for now and after surgery pharmacological DVT prophylaxis per orthopedics. Code Status: DNR. Family Communication: Discussed with patient. Disposition Plan: Will need rehab. Consults called: Orthopedics. Admission status: Inpatient.   Rise Patience MD [facility] Pager [phone].  If 7PM-7AM, please contact night-coverage [email or web address] Password [facility]  11/30/2019, 12:09 AM

## 2019-11-30 NOTE — Transfer of Care (Signed)
Immediate Anesthesia Transfer of Care Note  Patient: Kari Colon  Procedure(s) Performed: ANTERIOR APPROACH HEMI HIP ARTHROPLASTY (Left Hip)  Patient Location: PACU  Anesthesia Type:General  Level of Consciousness: awake, alert  and oriented  Airway & Oxygen Therapy: Patient Spontanous Breathing and Patient connected to nasal cannula oxygen  Post-op Assessment: Report given to RN and Post -op Vital signs reviewed and stable  Post vital signs: Reviewed and stable  Last Vitals:  Vitals Value Taken Time  BP 211/49 11/30/19 1542  Temp    Pulse 70 11/30/19 1543  Resp 14 11/30/19 1543  SpO2 100 % 11/30/19 1543  Vitals shown include unvalidated device data.  Last Pain:  Vitals:   11/30/19 1240  PainSc: 6          Complications: No apparent anesthesia complications

## 2019-11-30 NOTE — Anesthesia Postprocedure Evaluation (Signed)
Anesthesia Post Note  Patient: Kari Colon  Procedure(s) Performed: ANTERIOR APPROACH HEMI HIP ARTHROPLASTY (Left Hip)     Patient location during evaluation: PACU Anesthesia Type: General Level of consciousness: sedated Pain management: pain level controlled Vital Signs Assessment: post-procedure vital signs reviewed and stable Respiratory status: spontaneous breathing and respiratory function stable Cardiovascular status: stable Postop Assessment: no apparent nausea or vomiting Anesthetic complications: no    Last Vitals:  Vitals:   11/30/19 1540 11/30/19 1555  BP: (!) 211/49 (!) 148/111  Pulse: 72 71  Resp: 13 13  Temp: 36.8 C   SpO2: 100% 98%    Last Pain:  Vitals:   11/30/19 1240  PainSc: 6                  Myca Perno DANIEL

## 2019-11-30 NOTE — Anesthesia Preprocedure Evaluation (Addendum)
Anesthesia Evaluation  Patient identified by MRN, date of birth, ID band Patient awake    Reviewed: Allergy & Precautions, NPO status , Patient's Chart, lab work & pertinent test results  Airway Mallampati: I  TM Distance: >3 FB Neck ROM: Full    Dental  (+) Upper Dentures, Lower Dentures   Pulmonary Current Smoker,    breath sounds clear to auscultation       Cardiovascular hypertension, + Peripheral Vascular Disease  + Valvular Problems/Murmurs  Rhythm:Regular Rate:Normal  - Pulm HTN   Neuro/Psych PSYCHIATRIC DISORDERS Anxiety Depression negative neurological ROS     GI/Hepatic Neg liver ROS, PUD, GERD  ,  Endo/Other  diabetes  Renal/GU Renal InsufficiencyRenal disease     Musculoskeletal   Abdominal Normal abdominal exam  (+)   Peds  Hematology Factor V Def.    Anesthesia Other Findings   Reproductive/Obstetrics                            Lab Results  Component Value Date   WBC 13.6 (H) 11/30/2019   HGB 12.5 11/30/2019   HCT 38.4 11/30/2019   MCV 98.7 11/30/2019   PLT 224 11/30/2019   Lab Results  Component Value Date   CREATININE 0.85 11/30/2019   BUN 21 11/30/2019   NA 137 11/30/2019   K 4.2 11/30/2019   CL 104 11/30/2019   CO2 23 11/30/2019   No results found for: INR, PROTIME  EKG: sinus rhythm.   Anesthesia Physical Anesthesia Plan  ASA: III  Anesthesia Plan: General   Post-op Pain Management:    Induction: Intravenous  PONV Risk Score and Plan: 3 and Ondansetron and Treatment may vary due to age or medical condition  Airway Management Planned: Oral ETT  Additional Equipment: None  Intra-op Plan:   Post-operative Plan: Extubation in OR  Informed Consent: I have reviewed the patients History and Physical, chart, labs and discussed the procedure including the risks, benefits and alternatives for the proposed anesthesia with the patient or authorized  representative who has indicated his/her understanding and acceptance.     Consent reviewed with POA  Plan Discussed with: CRNA  Anesthesia Plan Comments:        Anesthesia Quick Evaluation

## 2019-11-30 NOTE — Progress Notes (Signed)
Pt arrived to room 6N11 via bed after surgery. Received report from Richardson Landry, [place] in PACU. See assessment. Will continue to monitor.

## 2019-12-01 ENCOUNTER — Encounter: Payer: Self-pay | Admitting: *Deleted

## 2019-12-01 LAB — CBC WITH DIFFERENTIAL/PLATELET
Abs Immature Granulocytes: 0.1 10*3/uL — ABNORMAL HIGH (ref 0.00–0.07)
Basophils Absolute: 0.1 10*3/uL (ref 0.0–0.1)
Basophils Relative: 0 %
Eosinophils Absolute: 0.1 10*3/uL (ref 0.0–0.5)
Eosinophils Relative: 1 %
HCT: 35.5 % — ABNORMAL LOW (ref 36.0–46.0)
Hemoglobin: 11.5 g/dL — ABNORMAL LOW (ref 12.0–15.0)
Immature Granulocytes: 1 %
Lymphocytes Relative: 10 %
Lymphs Abs: 1.3 10*3/uL (ref 0.7–4.0)
MCH: 31.4 pg (ref 26.0–34.0)
MCHC: 32.4 g/dL (ref 30.0–36.0)
MCV: 97 fL (ref 80.0–100.0)
Monocytes Absolute: 1.4 10*3/uL — ABNORMAL HIGH (ref 0.1–1.0)
Monocytes Relative: 10 %
Neutro Abs: 11 10*3/uL — ABNORMAL HIGH (ref 1.7–7.7)
Neutrophils Relative %: 78 %
Platelets: 213 10*3/uL (ref 150–400)
RBC: 3.66 MIL/uL — ABNORMAL LOW (ref 3.87–5.11)
RDW: 13.3 % (ref 11.5–15.5)
WBC: 14 10*3/uL — ABNORMAL HIGH (ref 4.0–10.5)
nRBC: 0 % (ref 0.0–0.2)

## 2019-12-01 LAB — GLUCOSE, CAPILLARY
Glucose-Capillary: 107 mg/dL — ABNORMAL HIGH (ref 70–99)
Glucose-Capillary: 126 mg/dL — ABNORMAL HIGH (ref 70–99)
Glucose-Capillary: 96 mg/dL (ref 70–99)

## 2019-12-01 LAB — CBC
HCT: 34.4 % — ABNORMAL LOW (ref 36.0–46.0)
Hemoglobin: 11.2 g/dL — ABNORMAL LOW (ref 12.0–15.0)
MCH: 31.3 pg (ref 26.0–34.0)
MCHC: 32.6 g/dL (ref 30.0–36.0)
MCV: 96.1 fL (ref 80.0–100.0)
Platelets: 228 10*3/uL (ref 150–400)
RBC: 3.58 MIL/uL — ABNORMAL LOW (ref 3.87–5.11)
RDW: 13.2 % (ref 11.5–15.5)
WBC: 15.1 10*3/uL — ABNORMAL HIGH (ref 4.0–10.5)
nRBC: 0 % (ref 0.0–0.2)

## 2019-12-01 LAB — BASIC METABOLIC PANEL
Anion gap: 15 (ref 5–15)
BUN: 15 mg/dL (ref 8–23)
CO2: 21 mmol/L — ABNORMAL LOW (ref 22–32)
Calcium: 8.6 mg/dL — ABNORMAL LOW (ref 8.9–10.3)
Chloride: 100 mmol/L (ref 98–111)
Creatinine, Ser: 1.07 mg/dL — ABNORMAL HIGH (ref 0.44–1.00)
GFR calc Af Amer: 56 mL/min — ABNORMAL LOW (ref >60)
GFR calc non Af Amer: 48 mL/min — ABNORMAL LOW (ref >60)
Glucose, Bld: 107 mg/dL — ABNORMAL HIGH (ref 70–99)
Potassium: 3.6 mmol/L (ref 3.5–5.1)
Sodium: 136 mmol/L (ref 135–145)

## 2019-12-01 LAB — URINALYSIS, ROUTINE W REFLEX MICROSCOPIC
Bilirubin Urine: NEGATIVE
Glucose, UA: NEGATIVE mg/dL
Ketones, ur: NEGATIVE mg/dL
Nitrite: NEGATIVE
Protein, ur: NEGATIVE mg/dL
Specific Gravity, Urine: 1.023 (ref 1.005–1.030)
pH: 5 (ref 5.0–8.0)

## 2019-12-01 LAB — LACTIC ACID, PLASMA: Lactic Acid, Venous: 1.4 mmol/L (ref 0.5–1.9)

## 2019-12-01 LAB — HEMOGLOBIN A1C
Hgb A1c MFr Bld: 5.9 % — ABNORMAL HIGH (ref 4.8–5.6)
Mean Plasma Glucose: 122.63 mg/dL

## 2019-12-01 LAB — PROCALCITONIN: Procalcitonin: 0.11 ng/mL

## 2019-12-01 MED ORDER — METHADONE HCL 5 MG PO TABS: 2.5000 mg | ORAL_TABLET | ORAL | Status: DC

## 2019-12-01 MED ORDER — SULFAMETHOXAZOLE-TRIMETHOPRIM 800-160 MG PO TABS: 1.0000 | ORAL_TABLET | Freq: Two times a day (BID) | ORAL | 0 refills | Status: AC

## 2019-12-01 MED ORDER — SULFAMETHOXAZOLE-TRIMETHOPRIM 800-160 MG PO TABS
1.0000 | ORAL_TABLET | Freq: Two times a day (BID) | ORAL | Status: DC
  Administered 2019-12-01 – 2019-12-02 (×2): 1 via ORAL
  Filled 2019-12-01 (×3): qty 1

## 2019-12-01 MED ORDER — METHADONE HCL 5 MG PO TABS
2.5000 mg | ORAL_TABLET | Freq: Two times a day (BID) | ORAL | Status: DC
  Administered 2019-12-01 – 2019-12-02 (×3): 2.5 mg via ORAL
  Filled 2019-12-01 (×3): qty 1

## 2019-12-01 NOTE — Evaluation (Signed)
Physical Therapy Evaluation Patient Details Name: Kari Colon MRN: [id number] DOB: 02/06/1935 Today's Date: 12/01/2019   History of Present Illness  Pt is a 85 y/o female with hx of chronic depression, B12 deficiency, factor V deficiency, fibromascular dysplasia, macular degeneration, pulmonary HTN,  who presented from ALF to ER after a fall (slipping out of wheelchair). Found with L hip fx, s/p anterior approach hemi arthroplasty 11/30/19.   Clinical Impression  Pt admitted with above diagnosis. Pt confused on eval, likely near baseline, gets frustrated with herself when she is unable to recall answers to questions. Pt required max A +2 for bed mobility and sit<>stand. However, practiced sit<>stand within stedy and pt was able to progress to mod A +2 which should be helpful for transfers to [facility] with nursing staff.   Pt currently with functional limitations due to the deficits listed below (see PT Problem List). Pt will benefit from skilled PT to increase their independence and safety with mobility to allow discharge to the venue listed below.       Follow Up Recommendations SNF;Supervision/Assistance - 24 hour    Equipment Recommendations  None recommended by PT    Recommendations for Other Services       Precautions / Restrictions Precautions Precautions: Fall Precaution Comments: pt fell out of w/c Restrictions Weight Bearing Restrictions: Yes LLE Weight Bearing: Weight bearing as tolerated      Mobility  Bed Mobility Overal bed mobility: Needs Assistance Bed Mobility: Supine to Sit;Sit to Supine     Supine to sit: Max assist;+2 for physical assistance;+2 for safety/equipment Sit to supine: +2 for physical assistance;+2 for safety/equipment;Max assist   General bed mobility comments: requires support for LB and trunk, pt able to assist minimally with UEs using rail but increased time and cueing for sequencing/technique   Transfers Overall transfer level: Needs  assistance Equipment used: 2 person hand held assist Transfers: Sit to/from Stand Sit to Stand: Max assist;+2 physical assistance;+2 safety/equipment;Mod assist         General transfer comment: initially requires max assist +2 to power up from EOB with 2 person HHA, pulling on stedy A decreased to mod assist +2  Ambulation/Gait             General Gait Details: unable  Stairs            Wheelchair Mobility    Modified Rankin (Stroke Patients Only)       Balance Overall balance assessment: Needs assistance;History of Falls Sitting-balance support: No upper extremity supported;Feet supported Sitting balance-Leahy Scale: Poor Sitting balance - Comments: min guard at best, min A with posterior lean at times  Postural control: Posterior lean Standing balance support: Bilateral upper extremity supported;During functional activity Standing balance-Leahy Scale: Zero Standing balance comment: reliant on BUE and external support                             Pertinent Vitals/Pain Pain Assessment: Faces Faces Pain Scale: Hurts whole lot Pain Location: L hip  Pain Descriptors / Indicators: Operative site guarding;Discomfort;Grimacing Pain Intervention(s): Limited activity within patient's tolerance;Monitored during session    Home Living Family/patient expects to be discharged to:: Skilled nursing facility                 Additional Comments: unclear per pt report, but per chart from facility     Prior Function Level of Independence: Needs assistance   Gait / Transfers Assistance Needed: reports transfers  to wc independently, pushing wc independently but pt very poor historian   ADL's / Homemaking Assistance Needed: reports independent with sponge bathing and dressing   Comments: poor historian, unclear of accuracy but known wc bound      Hand Dominance   Dominant Hand: Right    Extremity/Trunk Assessment   Upper Extremity Assessment Upper  Extremity Assessment: Defer to OT evaluation    Lower Extremity Assessment Lower Extremity Assessment: Generalized weakness;LLE deficits/detail LLE Deficits / Details: pt avoidant of L hip mvmt, allowed 45 deg passive hip flex and small range heel slide but obvious discomfort noted LLE: Unable to fully assess due to pain LLE Sensation: WNL LLE Coordination: decreased gross motor    Cervical / Trunk Assessment Cervical / Trunk Assessment: Kyphotic  Communication   Communication: No difficulties  Cognition Arousal/Alertness: Awake/alert Behavior During Therapy: WFL for tasks assessed/performed Overall Cognitive Status: No family/caregiver present to determine baseline cognitive functioning Area of Impairment: Orientation;Attention;Memory;Following commands;Safety/judgement;Awareness;Problem solving                 Orientation Level: Disoriented to;Place;Time Current Attention Level: Sustained Memory: Decreased recall of precautions;Decreased short-term memory Following Commands: Follows one step commands consistently;Follows one step commands with increased time;Follows multi-step commands inconsistently Safety/Judgement: Decreased awareness of safety;Decreased awareness of deficits Awareness: Intellectual Problem Solving: Slow processing;Decreased initiation;Difficulty sequencing;Requires verbal cues General Comments: patient re-oriented to place and time, follows simple commands with increased time; pleasantly confused       General Comments General comments (skin integrity, edema, etc.): VSS    Exercises     Assessment/Plan    PT Assessment Patient needs continued PT services  PT Problem List Decreased strength;Decreased range of motion;Decreased activity tolerance;Decreased balance;Decreased mobility;Decreased coordination;Decreased cognition;Decreased knowledge of use of DME;Decreased safety awareness;Decreased knowledge of precautions;Pain       PT Treatment  Interventions DME instruction;Functional mobility training;Therapeutic activities;Therapeutic exercise;Balance training;Neuromuscular re-education;Cognitive remediation;Patient/family education    PT Goals (Current goals can be found in the Care Plan section)  Acute Rehab PT Goals Patient Stated Goal: less pain PT Goal Formulation: Patient unable to participate in goal setting Time For Goal Achievement: 12/15/19 Potential to Achieve Goals: Fair    Frequency Min 3X/week   Barriers to discharge        Co-evaluation PT/OT/SLP Co-Evaluation/Treatment: Yes Reason for Co-Treatment: Necessary to address cognition/behavior during functional activity PT goals addressed during session: Mobility/safety with mobility;Balance;Proper use of DME;Strengthening/ROM OT goals addressed during session: ADL's and self-care       AM-PAC PT "6 Clicks" Mobility  Outcome Measure Help needed turning from your back to your side while in a flat bed without using bedrails?: Total Help needed moving from lying on your back to sitting on the side of a flat bed without using bedrails?: A Lot Help needed moving to and from a bed to a chair (including a wheelchair)?: Total Help needed standing up from a chair using your arms (e.g., wheelchair or bedside chair)?: Total Help needed to walk in hospital room?: Total Help needed climbing 3-5 steps with a railing? : Total 6 Click Score: 7    End of Session   Activity Tolerance: Patient limited by pain Patient left: in bed;with call bell/phone within reach;with bed alarm set Nurse Communication: Mobility status PT Visit Diagnosis: Muscle weakness (generalized) (M62.81);Pain;Difficulty in walking, not elsewhere classified (R26.2);History of falling (Z91.81) Pain - Right/Left: Left Pain - part of body: Hip    Time: 1017-1040 PT Time Calculation (min) (ACUTE ONLY): 23 min  Charges:   PT Evaluation $PT Eval Moderate Complexity: [place]  Pager [phone] Office [place] 12/01/2019, 2:19 PM

## 2019-12-01 NOTE — Progress Notes (Signed)
Subjective: 1 Day Post-Op Procedure(s) (LRB): ANTERIOR APPROACH HEMI HIP ARTHROPLASTY (Left) Patient reports pain as mild.    Objective: Vital signs in last 24 hours: Temp:  [97.7 F (36.5 C)-98.6 F (37 C)] 98.5 F (36.9 C) (04/27 0515) Pulse Rate:  [62-77] 77 (04/27 0515) Resp:  [13-18] 17 (04/27 0515) BP: (138-211)/(41-111) 141/58 (04/27 0515) SpO2:  [91 %-100 %] 94 % (04/27 0515) Weight:  [60.3 kg] 60.3 kg (04/26 1834)  Intake/Output from previous day: 04/26 0701 - 04/27 0700 In: 2199.3 [P.O.:360; I.V.:1389.2; IV Piggyback:450.1] Out: 1175 [Urine:975; Blood:200] Intake/Output this shift: No intake/output data recorded.  Recent Labs    11/29/19 1933 11/30/19 0424 11/30/19 1906 12/01/19 0158  HGB 13.2 12.5 12.5 11.2*   Recent Labs    11/30/19 1906 12/01/19 0158  WBC 16.4* 15.1*  RBC 3.97 3.58*  HCT 38.3 34.4*  PLT 240 228   Recent Labs    11/30/19 0424 11/30/19 0424 11/30/19 1906 12/01/19 0158  NA 137  --   --  136  K 4.2  --   --  3.6  CL 104  --   --  100  CO2 23  --   --  21*  BUN 21  --   --  15  CREATININE 0.85   < > 1.07* 1.07*  GLUCOSE 142*  --   --  107*  CALCIUM 8.7*  --   --  8.6*   < > = values in this interval not displayed.   No results for input(s): LABPT, INR in the last 72 hours.  Neurovascular intact Sensation intact distally Intact pulses distally Dorsiflexion/Plantar flexion intact Incision: dressing C/D/I No cellulitis present Compartment soft   Assessment/Plan: 1 Day Post-Op Procedure(s) (LRB): ANTERIOR APPROACH HEMI HIP ARTHROPLASTY (Left) Up with therapy  WBAT LLE ABLA-mild and stable Lovenox 40 daily x 2 weeks for dvt ppx F/u with Dr. Erlinda Hong 2 weeks post-op for suture removal      Kari Colon 12/01/2019, 7:52 AM

## 2019-12-01 NOTE — Social Work (Signed)
Pt is Manufacturing engineer managed pt. TOC team will follow for disposition planning per [facility] and family.   Westley Hummer, MSW, [place] Work

## 2019-12-01 NOTE — Progress Notes (Signed)
Patient's daughter, Estill Bamberg called back informing this RN to call [employer] in [facility] care about the methadone dosage and Xanax which worked best for her mom due to benzo withdrawal.  Estill Bamberg cannot come to [facility] today due to urgent family matters.  Will endorse appropriately to day shift RN.

## 2019-12-01 NOTE — Progress Notes (Signed)
Called patient's daughter, Elease Etienne but no answer and updated her on patient's situation thru voicemail, requested her to come early to be able to calm down patient since pt. did not sleep the whole night shift due to restlessness.  Will endorse appropriately to day shift RN.

## 2019-12-01 NOTE — Progress Notes (Signed)
[place] Patient RN visit  This RN visited pt and pt's daughter at bedside. Pt slept throughout visit, NAD at this time.  Pts daughter had shared concerns re home med regime not being followed; bedside RN assured this RN and pt's daughter that med had been adjusted by Dr. Nevada Crane.  Pt's daughter shared concerns about pt losing bed at [facility]; these concerns shared with SW [place]. Plan made for hospice hospital liaison team to follow pt throughout stay.   Domenic Moras, BSN, [employer] in [place]

## 2019-12-01 NOTE — Progress Notes (Signed)
Initial Nutrition Assessment  DOCUMENTATION CODES:   Not applicable  INTERVENTION:   - Magic cup TID with meals, each supplement provides 290 kcal and 9 grams of protein  - Took pt's food preferences  - Liberalize diet to Regular, verbal with readback order placed per Dr. [place] Crane  NUTRITION DIAGNOSIS:   Increased nutrient needs related to post-op healing as evidenced by estimated needs.  GOAL:   Patient will meet greater than or equal to 90% of their needs  MONITOR:   PO intake, Supplement acceptance, Labs, Weight trends, Skin  REASON FOR ASSESSMENT:   Consult Hip fracture protocol  ASSESSMENT:   85 year old female who presented on 4/25 after falling out of her wheelchair. PMH of depression, vitamin B-12 deficiency, T2DM, factor V deficiency, and HTN. Pt is followed by hospice care. Pt found to have left hip fracture.   4/26 - s/p prosthetic replacement for femoral neck fracture  Spoke with pt's daughter at bedside. Pt's daughter provided RD with pt's food preferences. RD entered preferences into [employer] diet software.  Pt's daughter shares that pt is very picky when it comes to food from institutions. Pt's daughter states that pt enjoys sweets and likes ice cream. RD to order Magic Cup with meals to aid pt in meeting kcal and protein needs. Pt's daughter reports pt does not like other oral nutrition supplements.  Pt's daughter shares that pt's weight fluctuates. Pt lost 40 lbs at one point then gained most of the weight back. Reviewed weight history in chart. Current weight is consistent with weight from 2 years ago. However, it appears that current weight may be copied forward rather than measured.  Discussed diet liberalization with MD who agreed.  Meal Completion: 25-50%  Medications reviewed and include: colace, psyllium, senna, NS @ 75 ml/hr  Labs reviewed. CBG's: 96-191 x 24 hours  UOP: 975 ml x 24 hours  NUTRITION - FOCUSED PHYSICAL EXAM:  Deferred per  pt's daughter's request.  Diet Order:   Diet Order            Diet regular Room service appropriate? Yes; Fluid consistency: Thin  Diet effective now              EDUCATION NEEDS:   No education needs have been identified at this time  Skin:  Skin Assessment: Skin Integrity Issues: Incisions: left hip  Last BM:  12/01/19 smear type 4  Height:   Ht Readings from Last 1 Encounters:  11/30/19 5\' 3"  (1.6 m)    Weight:   Wt Readings from Last 1 Encounters:  11/30/19 60.3 kg    Ideal Body Weight:  52.3 kg  BMI:  Body mass index is 23.55 kg/m.  Estimated Nutritional Needs:   Kcal:  1400-1600  Protein:  65-80 grams  Fluid:  1.4-1.6 L    Gaynell Face, MS, RD, LDN Inpatient Clinical Dietitian Pager: [phone] Weekend/After Hours: [phone]

## 2019-12-01 NOTE — NC FL2 (Signed)
[place] MEDICAID FL2 LEVEL OF CARE SCREENING TOOL     IDENTIFICATION  Patient Name: Kari Colon Birthdate: 1934-08-24 Sex: female Admission Date (Current Location): 11/29/2019  [facility] and [place] Number:  Herbalist and Address:  [facility], [place] [address], [place], [place] [address]      Provider Number: [id number]  Attending Physician Name and Address:  Kayleen Memos, DO  Relative Name and Phone Number:       Current Level of Care: Hospital Recommended Level of Care: [place] Prior Approval Number:    Date Approved/Denied:   PASRR Number: [id number]  Discharge Plan: SNF    Current Diagnoses: Patient Active Problem List   Diagnosis Date Noted  . Closed displaced fracture of left femoral neck ([place]) 11/29/2019  . Hip fracture ([place]) 11/29/2019  . Syncope 12/29/2017  . Carotid bruit 12/29/2017  . Depression with anxiety 12/29/2017  . Factor V deficiency ([place]) 12/29/2017  . GERD (gastroesophageal reflux disease) 12/29/2017  . Heart murmur 12/29/2017    Orientation RESPIRATION BLADDER Height & Weight    Oriented to Self    Normal Incontinent Weight: 132 lb 15 oz (60.3 kg) Height:  5\' 3"  (160 cm)  BEHAVIORAL SYMPTOMS/MOOD NEUROLOGICAL BOWEL NUTRITION STATUS      Continent Diet(see discharge summary)  AMBULATORY STATUS COMMUNICATION OF NEEDS Skin   Extensive Assist Verbally Surgical wounds(closed incision on L hip w/ Hydrocolloid)                       Personal Care Assistance Level of Assistance  Bathing, Feeding, Dressing Bathing Assistance: Maximum assistance Feeding assistance: Independent Dressing Assistance: Maximum assistance     Functional Limitations Info  Sight, Hearing, Speech Sight Info: Adequate Hearing Info: Adequate Speech Info: Adequate    SPECIAL CARE FACTORS FREQUENCY  PT (By licensed PT), OT (By licensed OT)     PT Frequency: 5x week OT Frequency: 5x week             Contractures Contractures Info: Not present    Additional Factors Info  Code Status, Allergies, Psychotropic Code Status Info: DNR Allergies Info: Mirapex (Pramipexole Dihydrochloride), Nexium (Esomeprazole Magnesium), Prednisone, Prevacid (Lansoprazole), Ticlid (Ticlopidine), Wellbutrin (Bupropion), Zantac (Ranitidine Hcl), Amoxil (Amoxicillin), Effexor (Venlafaxine), Penicillins Psychotropic Info: LORazepam (ATIVAN) tablet 1 mg 3x daily PO; sertraline (ZOLOFT) tablet 25 mg daily PO         Current Medications (12/01/2019):  This is the current hospital active medication list Current Facility-Administered Medications  Medication Dose Route Frequency Provider Last Rate Last Admin  . 0.9 %  sodium chloride infusion   Intravenous Continuous Leandrew Koyanagi, MD 50 mL/hr at 12/01/19 0551 Rate Verify at 12/01/19 0551  . acetaminophen (TYLENOL) tablet 325-650 mg  325-650 mg Oral Q6H PRN Leandrew Koyanagi, MD      . acetaminophen (TYLENOL) tablet 500 mg  500 mg Oral Q6H Leandrew Koyanagi, MD   500 mg at 12/01/19 0549  . alum & mag hydroxide-simeth (MAALOX/MYLANTA) 200-200-20 MG/5ML suspension 30 mL  30 mL Oral Q4H PRN Leandrew Koyanagi, MD      . docusate sodium (COLACE) capsule 100 mg  100 mg Oral BID Leandrew Koyanagi, MD   100 mg at 12/01/19 1012  . enoxaparin (LOVENOX) injection 40 mg  40 mg Subcutaneous Q24H Leandrew Koyanagi, MD      . hydrALAZINE (APRESOLINE) injection 5 mg  5 mg Intravenous Q4H PRN Leandrew Koyanagi,  MD   5 mg at 11/30/19 0542  . HYDROcodone-acetaminophen (NORCO) 7.5-325 MG per tablet 1-2 tablet  1-2 tablet Oral Q4H PRN Leandrew Koyanagi, MD   2 tablet at 12/01/19 0330  . HYDROcodone-acetaminophen (NORCO/VICODIN) 5-325 MG per tablet 1-2 tablet  1-2 tablet Oral Q4H PRN Leandrew Koyanagi, MD      . lactated ringers infusion   Intravenous Continuous Leandrew Koyanagi, MD 10 mL/hr at 11/30/19 1215 Restarted at 11/30/19 1355  . LORazepam (ATIVAN) tablet 1 mg  1 mg Oral TID Leandrew Koyanagi, MD   1 mg at 12/01/19  1012  . magnesium citrate solution 1 Bottle  1 Bottle Oral Once PRN Leandrew Koyanagi, MD      . menthol-cetylpyridinium (CEPACOL) lozenge 3 mg  1 lozenge Oral PRN Leandrew Koyanagi, MD       Or  . phenol (CHLORASEPTIC) mouth spray 1 spray  1 spray Mouth/Throat PRN Leandrew Koyanagi, MD      . methadone (DOLOPHINE) tablet 2.5 mg  2.5 mg Oral UD Hall, Carole N, DO      . methocarbamol (ROBAXIN) tablet 500 mg  500 mg Oral Q6H PRN Leandrew Koyanagi, MD   500 mg at 11/30/19 2046   Or  . methocarbamol (ROBAXIN) 500 mg in dextrose 5 % 50 mL IVPB  500 mg Intravenous Q6H PRN Leandrew Koyanagi, MD      . morphine 2 MG/ML injection 1 mg  1 mg Intravenous Q2H PRN Leandrew Koyanagi, MD   1 mg at 12/01/19 0043  . ondansetron (ZOFRAN) tablet 4 mg  4 mg Oral Q6H PRN Leandrew Koyanagi, MD       Or  . ondansetron [facility]) injection 4 mg  4 mg Intravenous Q6H PRN Leandrew Koyanagi, MD      . polyethylene glycol (MIRALAX / GLYCOLAX) packet 17 g  17 g Oral Daily PRN Leandrew Koyanagi, MD      . psyllium (HYDROCIL/METAMUCIL) packet 1 packet  1 packet Oral QHS Leandrew Koyanagi, MD   1 packet at 11/30/19 2046  . senna-docusate (Senokot-S) tablet 1 tablet  1 tablet Oral QHS Leandrew Koyanagi, MD   1 tablet at 11/30/19 2046  . sertraline (ZOLOFT) tablet 25 mg  25 mg Oral Daily Leandrew Koyanagi, MD   25 mg at 12/01/19 1012  . sorbitol 70 % solution 30 mL  30 mL Oral Daily PRN Leandrew Koyanagi, MD         Discharge Medications: Please see discharge summary for a list of discharge medications.  Relevant Imaging Results:  Relevant Lab Results:   Additional Information SS#: [id number]; hospice pt w/ [place], LCSW

## 2019-12-01 NOTE — Progress Notes (Addendum)
PROGRESS NOTE  Kari Colon QN:[id number] DOB: 1934-11-02 DOA: 11/29/2019 PCP: [facility]  HPI/Recap of past 24 hours: Kari Colon is a 85 y.o. female with history of chronic depression, B12 deficiency, factor V deficiency who presented from assisted living facility to the ER after a fall.  Patient states she slipped off the wheelchair and landed on her back.  Denies hitting her head or losing consciousness.    Of note patient has been followed by hospice care.  ED Course: X-ray showed left hip fracture and on call orthopedic surgeon Dr. Erlinda Hong was consulted and patient is being admitted for further management of her hip fracture.  Post left hip repair on 11/30/2019.  12/01/19: Seen and examined.  Reports left hip pain.  Restarted prior to admission hospice regimen as requested by family.  Assessment/Plan: Principal Problem:   Closed displaced fracture of left femoral neck (HCC) Active Problems:   Depression with anxiety   Factor V deficiency (HCC)   Hip fracture (HCC)  Left hip fracture 2/2 mechanical fall post repair on 11/30/2019 by Dr. Erlinda Hong POD #1 Continue pain control PT OT/dressing changes/DVT prophylaxis per orthopedics recommendation Orthopedics recommends up with therapy, weightbearing as tolerated in left lower extremity, subcu Lovenox 40 mg daily x2 weeks for DVT prophylaxis, follow-up with Dr. Erlinda Hong 2 weeks postop for suture removal.  Leukocytosis, possibly reactive in the setting of hip fracture WBC 15K R/o infective process Obtain UA, Ucx, CBC w diff, procalcitonin and lactic acid Follow results Treat if indicated  Mild anion gap metabolic acidosis Serum bicarb 21 and anion gap of 15 Continue IV fluid hydration Repeat BMP in the morning  CKD 3B Appears to be at her baseline creatinine 1.0 with GFR 48 Avoid nephrotoxins Monitor urine output Continue IV fluids Repeat BMP in the morning  History of diet-controlled diabetes Obtain A1c Last  hemoglobin A1c was 5.7 in 2019  Chronic pain syndrome/chronic depression/chronic anxiety On methadone, Zoloft, and Ativan Resume home medications/regimen  History of factor V Leyden deficiency  Goals of care Patient is a hospice care patient DNR   DVT prophylaxis:  Subcu Lovenox daily Code Status: DNR. Family Communication:  Will update family.  Disposition Plan:  Patient is from ALF.  Anticipate DC to SNF likely tomorrow.  Barrier to discharge: Evaluation for leukocytosis.  Consults called: Orthopedics.   Objective: Vitals:   11/30/19 1834 11/30/19 2003 12/01/19 0135 12/01/19 0515  BP: (!) 144/45 (!) 140/45 (!) 138/41 (!) 141/58  Pulse: 69 70 76 77  Resp: 17 17 17 17   Temp: 97.9 F (36.6 C) 97.7 F (36.5 C) 98.6 F (37 C) 98.5 F (36.9 C)  TempSrc: Oral Oral Oral Oral  SpO2: 96% 98% 96% 94%  Weight: 60.3 kg     Height: 5\' 3"  (1.6 m)       Intake/Output Summary (Last 24 hours) at 12/01/2019 1112 Last data filed at 12/01/2019 0551 Gross per 24 hour  Intake 2199.3 ml  Output 1175 ml  Net 1024.3 ml   Filed Weights   11/30/19 1834  Weight: 60.3 kg    Exam:  . General: 84 y.o. year-old female well developed well nourished in no acute distress.  Alert and interactive. . Cardiovascular: Regular rate and rhythm with no rubs or gallops.   Marland Kitchen Respiratory: Clear to auscultation with no wheezes or rales. Good inspiratory effort. . Abdomen: Soft nontender nondistended with normal bowel sounds x4 quadrants. . Musculoskeletal: No lower extremity edema bilaterally.  Left hip dressing clean dry  and intact. Marland Kitchen Psychiatry: Mood is appropriate for condition and setting   Data Reviewed: CBC: Recent Labs  Lab 11/29/19 1933 11/30/19 0424 11/30/19 1906 12/01/19 0158  WBC 12.3* 13.6* 16.4* 15.1*  NEUTROABS 9.8*  --   --   --   HGB 13.2 12.5 12.5 11.2*  HCT 40.5 38.4 38.3 34.4*  MCV 98.3 98.7 96.5 96.1  PLT 242 224 240 [id number]   Basic Metabolic Panel: Recent Labs  Lab  11/29/19 1933 11/30/19 0424 11/30/19 1906 12/01/19 0158  NA 135 137  --  136  K 4.2 4.2  --  3.6  CL 101 104  --  100  CO2 24 23  --  21*  GLUCOSE 138* 142*  --  107*  BUN 21 21  --  15  CREATININE 1.03* 0.85 1.07* 1.07*  CALCIUM 8.9 8.7*  --  8.6*   GFR: Estimated Creatinine Clearance: 33 mL/min (A) (by C-G formula based on SCr of 1.07 mg/dL (H)). Liver Function Tests: Recent Labs  Lab 11/29/19 1933  AST 17  ALT 14  ALKPHOS 48  BILITOT 0.5  PROT 6.2*  ALBUMIN 3.8   No results for input(s): LIPASE, AMYLASE in the last 168 hours. No results for input(s): AMMONIA in the last 168 hours. Coagulation Profile: No results for input(s): INR, PROTIME in the last 168 hours. Cardiac Enzymes: No results for input(s): CKTOTAL, CKMB, CKMBINDEX, TROPONINI in the last 168 hours. BNP (last 3 results) No results for input(s): PROBNP in the last 8760 hours. HbA1C: No results for input(s): HGBA1C in the last 72 hours. CBG: Recent Labs  Lab 11/30/19 1142 11/30/19 1552 11/30/19 1832 11/30/19 1957 12/01/19 0851  GLUCAP 94 159* 153* 191* 107*   Lipid Profile: No results for input(s): CHOL, HDL, LDLCALC, TRIG, CHOLHDL, LDLDIRECT in the last 72 hours. Thyroid Function Tests: No results for input(s): TSH, T4TOTAL, FREET4, T3FREE, THYROIDAB in the last 72 hours. Anemia Panel: No results for input(s): VITAMINB12, FOLATE, FERRITIN, TIBC, IRON, RETICCTPCT in the last 72 hours. Urine analysis:    Component Value Date/Time   COLORURINE YELLOW 02/03/2018 1047   APPEARANCEUR CLEAR 02/03/2018 1047   LABSPEC 1.015 02/03/2018 1047   PHURINE 5.0 02/03/2018 1047   GLUCOSEU NEGATIVE 02/03/2018 1047   HGBUR NEGATIVE 02/03/2018 1047   [place] 02/03/2018 1047   KETONESUR NEGATIVE 02/03/2018 1047   PROTEINUR NEGATIVE 02/03/2018 1047   NITRITE NEGATIVE 02/03/2018 1047   LEUKOCYTESUR NEGATIVE 02/03/2018 1047   Sepsis Labs: @LABRCNTIP (procalcitonin:4,lacticidven:4)  ) Recent  Results (from the past 240 hour(s))  Respiratory Panel by RT PCR (Flu A&B, Covid) - Nasopharyngeal Swab     Status: None   Collection Time: 11/29/19  8:51 PM   Specimen: Nasopharyngeal Swab  Result Value Ref Range Status   SARS Coronavirus 2 by RT PCR NEGATIVE NEGATIVE Final    Comment: (NOTE) SARS-CoV-2 target nucleic acids are NOT DETECTED. The SARS-CoV-2 RNA is generally detectable in upper respiratoy specimens during the acute phase of infection. The lowest concentration of SARS-CoV-2 viral copies this assay can detect is 131 copies/mL. A negative result does not preclude SARS-Cov-2 infection and should not be used as the sole basis for treatment or other patient management decisions. A negative result may occur with  improper specimen collection/handling, submission of specimen other than nasopharyngeal swab, presence of viral mutation(s) within the areas targeted by this assay, and inadequate number of viral copies (<131 copies/mL). A negative result must be combined with clinical observations, patient history, and epidemiological information.  The expected result is Negative. Fact Sheet for Patients:  PinkCheek.be Fact Sheet for Healthcare Providers:  GravelBags.it This test is not yet ap proved or cleared by the [place] FDA and  has been authorized for detection and/or diagnosis of SARS-CoV-2 by FDA under an Emergency Use Authorization (EUA). This EUA will remain  in effect (meaning this test can be used) for the duration of the COVID-19 declaration under Section 564(b)(1) of the Act, 21 U.S.C. section 360bbb-3(b)(1), unless the authorization is terminated or revoked sooner.    Influenza A by PCR NEGATIVE NEGATIVE Final   Influenza B by PCR NEGATIVE NEGATIVE Final    Comment: (NOTE) The Xpert Xpress SARS-CoV-2/FLU/RSV assay is intended as an aid in  the diagnosis of influenza from Nasopharyngeal swab specimens  and  should not be used as a sole basis for treatment. Nasal washings and  aspirates are unacceptable for Xpert Xpress SARS-CoV-2/FLU/RSV  testing. Fact Sheet for Patients: PinkCheek.be Fact Sheet for Healthcare Providers: GravelBags.it This test is not yet approved or cleared by the [place] FDA and  has been authorized for detection and/or diagnosis of SARS-CoV-2 by  FDA under an Emergency Use Authorization (EUA). This EUA will remain  in effect (meaning this test can be used) for the duration of the  Covid-19 declaration under Section 564(b)(1) of the Act, 21  U.S.C. section 360bbb-3(b)(1), unless the authorization is  terminated or revoked. Performed at [facility], [place] [address], [place], [place] [address]       Studies: Pelvis Portable  Result Date: 11/30/2019 CLINICAL DATA:  Postop right hip replacement is the given clinical information. EXAM: PORTABLE PELVIS 1-2 VIEWS COMPARISON:  11/29/2019 FINDINGS: Bipolar hip replacement performed on the left for treatment of femoral neck fracture. Older hip arthroplasty on the right with cerclage wire. IMPRESSION: Interval bipolar hip replacement on the left. Electronically Signed   By: Nelson Chimes M.D.   On: 11/30/2019 16:14   DG C-Arm 1-60 Min  Result Date: 11/30/2019 CLINICAL DATA:  85 year old female undergoing left hip replacement. EXAM: OPERATIVE left HIP (WITH PELVIS IF PERFORMED) 2 VIEWS TECHNIQUE: Fluoroscopic spot image(s) were submitted for interpretation post-operatively. COMPARISON:  CT Abdomen and Pelvis 10/30/2017. FLUOROSCOPY TIME:  0 minutes 6 seconds FINDINGS: 2 intraoperative fluoroscopic AP spot views of the left hip and lower pelvis. New left hip arthroplasty hardware appears intact with normal AP alignment. No unexpected osseous changes identified. Pre-existing right hip arthroplasty. IMPRESSION: Intraoperative images of left hip  arthroplasty with no adverse features. Electronically Signed   By: Genevie Ann M.D.   On: 11/30/2019 16:20   DG HIP OPERATIVE UNILAT W OR W/O PELVIS LEFT  Result Date: 11/30/2019 CLINICAL DATA:  85 year old female undergoing left hip replacement. EXAM: OPERATIVE left HIP (WITH PELVIS IF PERFORMED) 2 VIEWS TECHNIQUE: Fluoroscopic spot image(s) were submitted for interpretation post-operatively. COMPARISON:  CT Abdomen and Pelvis 10/30/2017. FLUOROSCOPY TIME:  0 minutes 6 seconds FINDINGS: 2 intraoperative fluoroscopic AP spot views of the left hip and lower pelvis. New left hip arthroplasty hardware appears intact with normal AP alignment. No unexpected osseous changes identified. Pre-existing right hip arthroplasty. IMPRESSION: Intraoperative images of left hip arthroplasty with no adverse features. Electronically Signed   By: Genevie Ann M.D.   On: 11/30/2019 16:20    Scheduled Meds: . acetaminophen  500 mg Oral Q6H  . docusate sodium  100 mg Oral BID  . enoxaparin (LOVENOX) injection  40 mg Subcutaneous Q24H  . LORazepam  1 mg Oral  TID  . methadone  2.5 mg Oral UD  . psyllium  1 packet Oral QHS  . senna-docusate  1 tablet Oral QHS  . sertraline  25 mg Oral Daily    Continuous Infusions: . sodium chloride 50 mL/hr at 12/01/19 0551  . lactated ringers 10 mL/hr at 11/30/19 1215  . methocarbamol (ROBAXIN) IV       LOS: 2 days     Kayleen Memos, MD [facility] Pager [phone]  If 7PM-7AM, please contact night-coverage [email or web address] Password [facility] 12/01/2019, 11:12 AM

## 2019-12-01 NOTE — Progress Notes (Signed)
Patient pulled out terminals of telemetry monitor, unhook connection of IV catheter and trying to get of of bed. Text paged on call [facility] and Dr. Rachael Fee called back and place order to discontinue telemetry and safety precautions 1 to 1 observation.    Reconnect IV catheter, re-wrap IV site arm with kerlex, repositioned patient on bed, administered PRN pain medication.  Will monitor closely.

## 2019-12-01 NOTE — Evaluation (Signed)
Occupational Therapy Evaluation Patient Details Name: Kari Colon MRN: [id number] DOB: [birth year] Today's Date: 12/01/2019    History of Present Illness Pt is a 85 y/o female with hx of chronic depression, B12 deficiency, factor V deficiency, fibromascular dysplasia, macular degeneration, pulmonary HTN,  who presented from ALF to ER after a fall (slipping out of wheelchair). Found with L hip fx, s/p anterior approach hemi arthroplasty 11/30/19.    Clinical Impression   PTA patient reports w/c bound but able to self propel w/c, transfer and complete basic ADLs with modified independence. Noted poor historian, and unclear of accuracy of history.  Admitted for above and limited by problem list below, including impaired balance, L hip pain, generalized weakness and impaired cognition. Patient re-oriented to place and time, requires increased time to process, follows simple commands.  She currently requires min-total assist +2 for ADLs, mod-max assist +2 for sit to stand transfers, max assist +2 for bed mobility.  She will benefit from further OT services while admitted and after dc at SNF level to optimize independence with ADLs, mobility to decrease burden of care.     Follow Up Recommendations  SNF;Supervision/Assistance - 24 hour    Equipment Recommendations  Other (comment)(TBD at next venue of care)    Recommendations for Other Services       Precautions / Restrictions Precautions Precautions: Fall Restrictions Weight Bearing Restrictions: Yes LLE Weight Bearing: Weight bearing as tolerated      Mobility Bed Mobility Overal bed mobility: Needs Assistance Bed Mobility: Supine to Sit;Sit to Supine     Supine to sit: Max assist;+2 for physical assistance;+2 for safety/equipment Sit to supine: +2 for physical assistance;+2 for safety/equipment;Max assist   General bed mobility comments: requires support for LB and trunk, pt able to assist minimally with UEs using rail but  increased time and cueing for sequencing/technique   Transfers Overall transfer level: Needs assistance Equipment used: 2 person hand held assist Transfers: Sit to/from Stand Sit to Stand: Max assist;+2 physical assistance;+2 safety/equipment;Mod assist         General transfer comment: initially requires max assist +2 to power up from EOB with 2 person HHA, pulling on stedy A decreased to mod assist +2    Balance Overall balance assessment: Needs assistance Sitting-balance support: No upper extremity supported;Feet supported Sitting balance-Leahy Scale: Poor Sitting balance - Comments: min guard at best, min A with posterior lean at times  Postural control: Posterior lean Standing balance support: Bilateral upper extremity supported;During functional activity Standing balance-Leahy Scale: Poor Standing balance comment: reliant on BUE and external support                           ADL either performed or assessed with clinical judgement   ADL Overall ADL's : Needs assistance/impaired     Grooming: Minimal assistance;Sitting   Upper Body Bathing: Minimal assistance;Sitting   Lower Body Bathing: Total assistance;Sit to/from stand;+2 for physical assistance;+2 for safety/equipment   Upper Body Dressing : Minimal assistance;Sitting   Lower Body Dressing: Total assistance;+2 for physical assistance;+2 for safety/equipment;Sit to/from Health and safety inspector Details (indicate cue type and reason): deferred         Functional mobility during ADLs: Maximal assistance;+2 for physical assistance;+2 for safety/equipment;Cueing for safety;Cueing for sequencing General ADL Comments: pt limited by weakness, L hip pain, impaired balance and cognition      Vision Baseline Vision/History: Macular Degeneration Patient Visual Report: No change from  baseline Vision Assessment?: No apparent visual deficits     Perception     Praxis      Pertinent Vitals/Pain Pain  Assessment: Faces Faces Pain Scale: Hurts whole lot Pain Location: L hip  Pain Descriptors / Indicators: Operative site guarding;Discomfort;Grimacing Pain Intervention(s): Limited activity within patient's tolerance;Monitored during session;Repositioned     Hand Dominance Right   Extremity/Trunk Assessment Upper Extremity Assessment Upper Extremity Assessment: Generalized weakness   Lower Extremity Assessment Lower Extremity Assessment: Defer to PT evaluation       Communication Communication Communication: No difficulties   Cognition Arousal/Alertness: Awake/alert Behavior During Therapy: WFL for tasks assessed/performed Overall Cognitive Status: No family/caregiver present to determine baseline cognitive functioning Area of Impairment: Orientation;Attention;Memory;Following commands;Safety/judgement;Awareness;Problem solving                 Orientation Level: Disoriented to;Place;Time Current Attention Level: Sustained Memory: Decreased recall of precautions;Decreased short-term memory Following Commands: Follows one step commands consistently;Follows one step commands with increased time;Follows multi-step commands inconsistently Safety/Judgement: Decreased awareness of safety;Decreased awareness of deficits Awareness: Intellectual Problem Solving: Slow processing;Decreased initiation;Difficulty sequencing;Requires verbal cues General Comments: patient re-oriented to place and time, follows simple commands with increased time; pleasantly confused    General Comments  VSS during session    Exercises     Shoulder Instructions      Home Living Family/patient expects to be discharged to:: Skilled nursing facility                                 Additional Comments: unclear per pt report, but per chart from facility       Prior Functioning/Environment Level of Independence: Needs assistance  Gait / Transfers Assistance Needed: reports transfers to  wc independently, pushing wc independently  ADL's / Homemaking Assistance Needed: reports independent with sponge bathing and dressing    Comments: poor historian, unclear of accuracy but known wc bound         OT Problem List: Decreased strength;Decreased activity tolerance;Impaired balance (sitting and/or standing);Decreased cognition;Decreased safety awareness;Decreased knowledge of use of DME or AE;Decreased knowledge of precautions;Pain      OT Treatment/Interventions: Self-care/ADL training;DME and/or AE instruction;Therapeutic activities;Therapeutic exercise;Patient/family education;Balance training;Cognitive remediation/compensation    OT Goals(Current goals can be found in the care plan section) Acute Rehab OT Goals Patient Stated Goal: less pain OT Goal Formulation: With patient Time For Goal Achievement: 12/15/19 Potential to Achieve Goals: Good  OT Frequency: Min 2X/week   Barriers to D/C:            Co-evaluation PT/OT/SLP Co-Evaluation/Treatment: Yes Reason for Co-Treatment: Necessary to address cognition/behavior during functional activity;For patient/therapist safety;To address functional/ADL transfers   OT goals addressed during session: ADL's and self-care      AM-PAC OT "6 Clicks" Daily Activity     Outcome Measure Help from another person eating meals?: A Little Help from another person taking care of personal grooming?: A Little Help from another person toileting, which includes using toliet, bedpan, or urinal?: Total Help from another person bathing (including washing, rinsing, drying)?: A Lot Help from another person to put on and taking off regular upper body clothing?: A Little Help from another person to put on and taking off regular lower body clothing?: Total 6 Click Score: 13   End of Session Nurse Communication: Mobility status;Precautions;Need for lift equipment  Activity Tolerance: Patient tolerated treatment well Patient left: in bed;with  call bell/phone within reach;with bed  alarm set;with nursing/sitter in room  OT Visit Diagnosis: Other abnormalities of gait and mobility (R26.89);Muscle weakness (generalized) (M62.81);Pain;History of falling (Z91.81);Other symptoms and signs involving cognitive function Pain - Right/Left: Left Pain - part of body: Hip                Time: [id number] OT Time Calculation (min): 28 min Charges:  OT General Charges $OT Visit: 1 Visit OT Evaluation $OT Eval Moderate Complexity: 1 Mod  Jolaine Artist, OT Acute Rehabilitation Services Pager [phone] Office [phone]    Kari Colon 12/01/2019, 2:01 PM

## 2019-12-02 LAB — CBC
HCT: 34.1 % — ABNORMAL LOW (ref 36.0–46.0)
Hemoglobin: 11.4 g/dL — ABNORMAL LOW (ref 12.0–15.0)
MCH: 32.6 pg (ref 26.0–34.0)
MCHC: 33.4 g/dL (ref 30.0–36.0)
MCV: 97.4 fL (ref 80.0–100.0)
Platelets: 209 10*3/uL (ref 150–400)
RBC: 3.5 MIL/uL — ABNORMAL LOW (ref 3.87–5.11)
RDW: 13.5 % (ref 11.5–15.5)
WBC: 13 10*3/uL — ABNORMAL HIGH (ref 4.0–10.5)
nRBC: 0 % (ref 0.0–0.2)

## 2019-12-02 LAB — BASIC METABOLIC PANEL
Anion gap: 8 (ref 5–15)
BUN: 16 mg/dL (ref 8–23)
CO2: 23 mmol/L (ref 22–32)
Calcium: 8.3 mg/dL — ABNORMAL LOW (ref 8.9–10.3)
Chloride: 105 mmol/L (ref 98–111)
Creatinine, Ser: 0.92 mg/dL (ref 0.44–1.00)
GFR calc Af Amer: >60 mL/min (ref >60)
GFR calc non Af Amer: 58 mL/min — ABNORMAL LOW (ref >60)
Glucose, Bld: 123 mg/dL — ABNORMAL HIGH (ref 70–99)
Potassium: 3.9 mmol/L (ref 3.5–5.1)
Sodium: 136 mmol/L (ref 135–145)

## 2019-12-02 LAB — GLUCOSE, CAPILLARY: Glucose-Capillary: 125 mg/dL — ABNORMAL HIGH (ref 70–99)

## 2019-12-02 MED ORDER — METHADONE HCL 5 MG PO TABS: 2.5000 mg | ORAL_TABLET | ORAL | 0 refills | Status: AC

## 2019-12-02 MED ORDER — LORAZEPAM 1 MG PO TABS: 1.0000 mg | ORAL_TABLET | Freq: Three times a day (TID) | ORAL | 0 refills | Status: AC

## 2019-12-02 NOTE — TOC Initial Note (Signed)
Transition of Care [facility]) - Initial/Assessment Note    Patient Details  Name: Kari Colon MRN: [id number] Date of Birth: 01/05/1935  Transition of Care [facility]) CM/SW Contact:    Alexander Mt, LCSW Phone Number: 12/02/2019, 10:52 AM  Clinical Narrative:                 CSW spoke with pt daughter Estill Bamberg [phone]). Plan for return to [facility] w/ [facility] hospice. Pt daughter aware and in agreement. States the only thing that she wants noted is for her photo album she brought to be sent back to SNF with her. CSW will alert RN Otila Kluver.   CSW awaits d/c summary to discharge pt back to SNF. Will also alert Librarian, academic. Elyse Hsu, admissions at [facility] is also aware.   Expected Discharge Plan: Skilled Nursing Facility Barriers to Discharge: Barriers Resolved   Patient Goals and CMS Choice Patient states their goals for this hospitalization and ongoing recovery are:: return to [facility] w/hospice CMS Medicare.gov Compare Post Acute Care list provided to:: Patient Represenative (must comment)(pt daughter; pt is LTC resident) Choice offered to / list presented to : Adult Children  Expected Discharge Plan and Services Expected Discharge Plan: Skilled Nursing Facility In-house Referral: Clinical Social Work Discharge Planning Services: CM Consult Post Acute Care Choice: Hospice, Resumption of Svcs/PTA Provider, [place] Living arrangements for the past 2 months: [place] Expected Discharge Date: 12/02/19                 Prior Living Arrangements/Services Living arrangements for the past 2 months: [place] Lives with:: Facility Resident Patient language and need for interpreter reviewed:: Yes Do you feel safe going back to the place where you live?: Yes      Need for Family Participation in Patient Care: Yes (Comment) Care giver support system in place?: Yes (comment) Current home services:  Hospice Criminal Activity/Legal Involvement Pertinent to Current Situation/Hospitalization: No - Comment as needed  Permission Sought/Granted Permission sought to share information with : Facility Sport and exercise psychologist, Family Supports Permission granted to share information with : No(pt disoriented)  Share Information with NAME: Jake Bathe  Permission granted to share info w AGENCY: [facility]  Permission granted to share info w Relationship: daughter  Permission granted to share info w Contact Information: [phone]  Emotional Assessment Appearance:: Appears stated age Attitude/Demeanor/Rapport: Unable to Assess Affect (typically observed): Unable to Assess Orientation: : Fluctuating Orientation (Suspected and/or reported Sundowners) Alcohol / Substance Use: Not Applicable(n/a) Psych Involvement: (n/a)  Admission diagnosis:  Hip fracture ([place]) [S72.009A] Left hip pain [M25.552] Closed fracture of left hip, initial encounter ([place]) [S72.002A] Fall, initial encounter [W19.XXXA] Patient Active Problem List   Diagnosis Date Noted  . Closed displaced fracture of left femoral neck ([place]) 11/29/2019  . Hip fracture ([place]) 11/29/2019  . Syncope 12/29/2017  . Carotid bruit 12/29/2017  . Depression with anxiety 12/29/2017  . Factor V deficiency ([place]) 12/29/2017  . GERD (gastroesophageal reflux disease) 12/29/2017  . Heart murmur 12/29/2017   PCP:  [facility]:   [facility] [address] [place], [place] [address] Phone: [phone] Fax: [place], [place] [address] [employer] [place] [address] Phone: [phone] Fax: [phone]   Readmission Risk Interventions Readmission Risk Prevention Plan 12/02/2019  Transportation Screening Complete  PCP or Specialist  Appt within 5-7 Days Not  Complete  Not Complete comments SNF resident  Home Care Screening Complete  Medication Review (RN CM) Referral to Pharmacy  Some recent data might be hidden

## 2019-12-02 NOTE — Care Management Important Message (Signed)
Important Message  Patient Details  Name: Kari Colon MRN: [id number] Date of Birth: 1934/09/24   Medicare Important Message Given:  Yes     Britt Petroni Montine Circle 12/02/2019, 1:18 PM

## 2019-12-02 NOTE — Discharge Summary (Addendum)
Discharge Summary  Kari Colon [id number] DOB: 1935/02/12  PCP: [facility]  Admit date: 11/29/2019 Discharge date: 12/02/2019  Time spent: 35 minutes   Recommendations for Outpatient Follow-up:  1. Continue with hospice care. 2. Follow up wit orthopedic surgery Dr. Erlinda Hong in 2 weeks. 3. Subcu Lovenox 40 mg daily x2 weeks for DVT prophylaxis as recommended by orthopedic surgery  Discharge Diagnoses:  Active Hospital Problems   Diagnosis Date Noted  . Closed displaced fracture of left femoral neck ([place]) 11/29/2019  . Hip fracture ([place]) 11/29/2019  . Factor V deficiency ([place]) 12/29/2017  . Depression with anxiety 12/29/2017    Resolved Hospital Problems  No resolved problems to display.     Vitals:   12/01/19 2102 12/02/19 0612  BP: (!) 128/44 (!) 147/72  Pulse: 75 86  Resp:  20  Temp:  99.7 F (37.6 C)  SpO2: 91% 90%    History of present illness:  Kari Poindexteris a 85 y.o.femalewithhistory of chronic depression, B12 deficiency, factor V Leyden deficiency who presented from assisted living facility under hospice care to the ER after a fall.femalewithhistory of chronic depression, B12 deficiency, factor V Leyden deficiency who presented from assisted living facility under hospice care to the ER after a fall.  Patient states she slipped off the wheelchair and landed on her back. Denies hitting her head or losing consciousness.   ED Course:X-ray showed left hip fracture.  Post left hip repair on 11/30/2019 by Dr Erlinda Hong.  Lab studies remarkable for leukocytosis and UA positive for pyuria.  Started on p.o. Bactrim 1 tablet twice daily to complete 7 days for presumed UTI.  Urine culture pending.  Previous urine culture from 12/30/2017 grew E. coli pansensitive.  12/02/19: Seen and examined.    No acute events overnight.  Patient will be transferred to SNF and will continue hospice care.  Recommendations from Orthopedic surgery team: 1 Day Post-Op Procedure(s) (LRB): ANTERIOR APPROACH HEMI HIP ARTHROPLASTY (Left) Up with therapy  WBAT LLE ABLA-mild and stable Lovenox 40 daily x 2 weeks for dvt ppx F/u with Dr. Erlinda Hong 2  weeks post-op for suture removal    Hospital Course:  Principal Problem:   Closed displaced fracture of left femoral neck (HCC) Active Problems:   Depression with anxiety   Factor V deficiency ([place])   Hip fracture ([place])  Left hip fracture 2/2 mechanical fall post repair on 11/30/2019 by Dr. Erlinda Hong POD #2 Continue pain control PT OT/dressing changes/DVT prophylaxis per orthopedics recommendation Orthopedics recommends up with therapy, weightbearing as tolerated in left lower extremity, subcu Lovenox 40 mg daily x2 weeks for DVT prophylaxis, follow-up with Dr. Erlinda Hong 2 weeks postop for suture removal.  Leukocytosis secondary to presumed UTI WBC is trending down, WBC 15 K>> 13 K Procalcitonin and lactic acid normal Continue Bactrim 1 tablet twice daily for total of 7 days, started on 12/01/2019. Follow-up with your PCP or hospice care.  Resolved mild anion gap metabolic acidosis  CKD 3A Appears to be at her baseline creatinine 0.9 with GFR 58 Continue to avoid nephrotoxins  History of diet-controlled diabetes Hemoglobin A1c was 5.7 in 2019 Hemoglobin 5.9 on 12/01/2019 Avoid hypoglycemia, CBGs have been stable in the 90-120s  Chronic pain syndrome/chronic depression/chronic anxiety On methadone, Zoloft, and Ativan Continue home medications/regimen Follow-up with hospice care   History of factor V Leyden deficiency Not on anticoagulation Continue subcu Lovenox as recommended by orthopedic surgery for DVT prophylaxis  Goals of care Patient is a hospice care patient DNR   DVT prophylaxis: Subcu Lovenox 40 mg daily x2 weeks post surgery  Code Status:DNR.  Consults called:Orthopedics.      Discharge  Exam: BP (!) 147/72 (BP Location: Left Arm)   Pulse 86   Temp 99.7 F (37.6 C) (Oral)   Resp 20   Ht 5\' 3"  (1.6 m)   Wt 60.3 kg   SpO2 90%   BMI 23.55 kg/m  . General: 85 y.o. year-old female well developed well nourished in no acute distress.  Alert and  interactive. . Cardiovascular: Regular rate and rhythm with no rubs or gallops.  Marland Kitchen Respiratory: Clear to auscultation with no wheezes or rales. . Abdomen: Soft nontender nondistended with normal bowel sounds. . Musculoskeletal: No lower extremity edema bilaterally.  Surgical site appears clean and dry. Marland Kitchen Psychiatry: Mood is appropriate for condition and setting  Discharge Instructions You were cared for by a hospitalist during your hospital stay. If you have any questions about your discharge medications or the care you received while you were in the hospital after you are discharged, you can call the unit and asked to speak with the hospitalist on call if the hospitalist that took care of you is not available. Once you are discharged, your primary care physician will handle any further medical issues. Please note that NO REFILLS for any discharge medications will be authorized once you are discharged, as it is imperative that you return to your primary care physician (or establish a relationship with a primary care physician if you do not have one) for your aftercare needs so that they can reassess your need for medications and monitor your lab values.  Discharge Instructions    Weight bearing as tolerated   Complete by: As directed    Weight bearing as tolerated   Complete by: As directed      Allergies as of 12/02/2019      Reactions   Mirapex [pramipexole Dihydrochloride] Other (See Comments)   insomnia   Nexium [esomeprazole Magnesium] Other (See Comments)   Ache in chest   Prednisone Nausea And Vomiting   Prevacid [lansoprazole] Other (See Comments)   Ache in chest   Ticlid [ticlopidine] Hives   Wellbutrin [bupropion] Hives, Other (See Comments)   lightheadedness   Zantac [ranitidine Hcl] Hives   Amoxil [amoxicillin] Rash   Effexor [venlafaxine] Rash   Penicillins Rash   Has patient had a PCN reaction causing immediate rash, facial/tongue/throat swelling, SOB or lightheadedness  with hypotension: Yes Has patient had a PCN reaction causing severe rash involving mucus membranes or skin necrosis: No Has patient had a PCN reaction that required hospitalization: No Has patient had a PCN reaction occurring within the last 10 years: No If all of the above answers are "NO", then may proceed with Cephalosporin use.      Medication List    STOP taking these medications   cephALEXin 500 MG capsule Commonly known as: KEFLEX   docusate sodium 100 MG capsule Commonly known as: COLACE   polyethylene glycol 17 g packet Commonly known as: MIRALAX / GLYCOLAX   traMADol 50 MG tablet Commonly known as: ULTRAM     TAKE these medications   acetaminophen 650 MG CR tablet Commonly known as: TYLENOL Take 650 mg by mouth in the morning, at noon, and at bedtime.   enoxaparin 40 MG/0.4ML injection Commonly known as: LOVENOX Inject 0.4 mLs (40 mg total) into the skin daily.   LORazepam 1 MG tablet Commonly known as: ATIVAN Take 1 tablet (1 mg total) by mouth 3 (three) times daily for 1 day.   methadone 5 MG tablet Commonly known as: DOLOPHINE Take 0.5 tablets (2.5  mg total) by mouth as directed for 3 days.   oxyCODONE-acetaminophen 5-325 MG tablet Commonly known as: Percocet Take 1-2 tablets by mouth every 8 (eight) hours as needed for severe pain.   psyllium 0.52 g capsule Commonly known as: REGULOID Take 0.52 g by mouth at bedtime.   sennosides-docusate sodium 8.6-50 MG tablet Commonly known as: SENOKOT-S Take 1 tablet by mouth at bedtime.   sertraline 25 MG tablet Commonly known as: ZOLOFT Take 25 mg by mouth daily.   sulfamethoxazole-trimethoprim 800-160 MG tablet Commonly known as: BACTRIM DS Take 1 tablet by mouth every 12 (twelve) hours for 6 days.            Discharge Care Instructions  (From admission, onward)         Start     Ordered   11/30/19 0000  Weight bearing as tolerated     11/30/19 1505   11/30/19 0000  Weight bearing as  tolerated     11/30/19 1514         Allergies  Allergen Reactions  . Mirapex [Pramipexole Dihydrochloride] Other (See Comments)    insomnia  . Nexium [Esomeprazole Magnesium] Other (See Comments)    Ache in chest  . Prednisone Nausea And Vomiting  . Prevacid [Lansoprazole] Other (See Comments)    Ache in chest  . Ticlid [Ticlopidine] Hives  . Wellbutrin [Bupropion] Hives and Other (See Comments)    lightheadedness  . Zantac [Ranitidine Hcl] Hives  . Amoxil [Amoxicillin] Rash  . Effexor [Venlafaxine] Rash  . Penicillins Rash    Has patient had a PCN reaction causing immediate rash, facial/tongue/throat swelling, SOB or lightheadedness with hypotension: Yes Has patient had a PCN reaction causing severe rash involving mucus membranes or skin necrosis: No Has patient had a PCN reaction that required hospitalization: No Has patient had a PCN reaction occurring within the last 10 years: No If all of the above answers are "NO", then may proceed with Cephalosporin use.    Contact information for follow-up providers    Leandrew Koyanagi, MD In 2 weeks.   Specialty: Orthopedic Surgery Why: For suture removal, For wound re-check Contact information: [place] [phone]            Contact information for after-discharge care    Destination    [facility] .   Service: Skilled Nursing Contact information: [address] [place] [phone]                   The results of significant diagnostics from this hospitalization (including imaging, microbiology, ancillary and laboratory) are listed below for reference.    Significant Diagnostic Studies: Pelvis Portable  Result Date: 11/30/2019 CLINICAL DATA:  Postop right hip replacement is the given clinical information. EXAM: PORTABLE PELVIS 1-2 VIEWS COMPARISON:  11/29/2019 FINDINGS: Bipolar hip replacement  performed on the left for treatment of femoral neck fracture. Older hip arthroplasty on the right with cerclage wire. IMPRESSION: Interval bipolar hip replacement on the left. Electronically Signed   By: Nelson Chimes M.D.   On: 11/30/2019 16:14   DG Chest Port 1 View  Result Date: 11/29/2019 CLINICAL DATA:  Left hip fracture, preoperative evaluation, hypertension, tobacco abuse EXAM: PORTABLE CHEST 1 VIEW COMPARISON:  02/03/2018 FINDINGS: Single frontal view of the chest demonstrates an unremarkable cardiac silhouette. Minimal atherosclerosis within the aortic arch. No airspace disease, effusion, or pneumothorax. No acute bony abnormality. IMPRESSION: 1.  No acute intrathoracic process. Electronically Signed   By: Randa Ngo M.D.   On: 11/29/2019 20:13   DG C-Arm 1-60 Min  Result Date: 11/30/2019 CLINICAL DATA:  85 year old female undergoing left hip replacement. EXAM: OPERATIVE left HIP (WITH PELVIS IF PERFORMED) 2 VIEWS TECHNIQUE: Fluoroscopic spot image(s) were submitted for interpretation post-operatively. COMPARISON:  CT Abdomen and Pelvis 10/30/2017. FLUOROSCOPY TIME:  0 minutes 6 seconds FINDINGS: 2 intraoperative fluoroscopic AP spot views of the left hip and lower pelvis. New left hip arthroplasty hardware appears intact with normal AP alignment. No unexpected osseous changes identified. Pre-existing right hip arthroplasty. IMPRESSION: Intraoperative images of left hip arthroplasty with no adverse features. Electronically Signed   By: Genevie Ann M.D.   On: 11/30/2019 16:20   DG HIP OPERATIVE UNILAT W OR W/O PELVIS LEFT  Result Date: 11/30/2019 CLINICAL DATA:  85 year old female undergoing left hip replacement. EXAM: OPERATIVE left HIP (WITH PELVIS IF PERFORMED) 2 VIEWS TECHNIQUE: Fluoroscopic spot image(s) were submitted for interpretation post-operatively. COMPARISON:  CT Abdomen and Pelvis 10/30/2017. FLUOROSCOPY TIME:  0 minutes 6 seconds FINDINGS: 2 intraoperative fluoroscopic AP spot views  of the left hip and lower pelvis. New left hip arthroplasty hardware appears intact with normal AP alignment. No unexpected osseous changes identified. Pre-existing right hip arthroplasty. IMPRESSION: Intraoperative images of left hip arthroplasty with no adverse features. Electronically Signed   By: Genevie Ann M.D.   On: 11/30/2019 16:20   DG Hip Unilat W or Wo Pelvis 2-3 Views Left  Result Date: 11/29/2019 CLINICAL DATA:  85 year old female with fall and left hip pain. EXAM: DG HIP (WITH OR WITHOUT PELVIS) 2-3V LEFT COMPARISON:  None. FINDINGS: There is a mildly displaced fracture of the left femoral neck. No dislocation. There is a right hip arthroplasty. The bones are osteopenic. The soft tissues are unremarkable. A pessary is noted over the pelvis. IMPRESSION: Mildly displaced fracture of the left femoral neck. No dislocation. Electronically Signed   By: Anner Crete M.D.   On: 11/29/2019 19:07    Microbiology: Recent Results (from the past 240 hour(s))  Respiratory Panel by RT PCR (Flu A&B, Covid) - Nasopharyngeal Swab     Status: None   Collection Time: 11/29/19  8:51 PM   Specimen: Nasopharyngeal Swab  Result Value Ref Range Status   SARS Coronavirus 2 by RT PCR NEGATIVE NEGATIVE Final    Comment: (NOTE) SARS-CoV-2 target nucleic acids are NOT DETECTED. The SARS-CoV-2 RNA is generally detectable in upper respiratoy specimens during the acute phase of infection. The lowest concentration of SARS-CoV-2 viral copies this assay can detect is 131 copies/mL. A negative result does not preclude SARS-Cov-2 infection and should not be used as the sole basis for treatment or other patient management decisions. A negative result may occur with  improper specimen collection/handling, submission of specimen other than nasopharyngeal swab, presence of viral mutation(s) within the areas targeted by this assay, and inadequate number of viral copies (<131 copies/mL). A negative result must be  combined with clinical observations, patient history, and epidemiological information. The expected result is Negative. Fact Sheet for Patients:  PinkCheek.be Fact Sheet for Healthcare Providers:  GravelBags.it This test is not yet ap proved or cleared by the [place] FDA and  has been authorized for detection and/or diagnosis of SARS-CoV-2 by FDA under an Emergency Use Authorization (EUA). This EUA will remain  in effect (meaning this test can be used) for the duration of the COVID-19 declaration under Section 564(b)(1) of the Act, 21  U.S.C. section 360bbb-3(b)(1), unless the authorization is terminated or revoked sooner.    Influenza A by PCR NEGATIVE NEGATIVE Final   Influenza B by PCR NEGATIVE NEGATIVE Final    Comment: (NOTE) The Xpert Xpress SARS-CoV-2/FLU/RSV assay is intended as an aid in  the diagnosis of influenza from Nasopharyngeal swab specimens and  should not be used as a sole basis for treatment. Nasal washings and  aspirates are unacceptable for Xpert Xpress SARS-CoV-2/FLU/RSV  testing. Fact Sheet for Patients: PinkCheek.be Fact Sheet for Healthcare Providers: GravelBags.it This test is not yet approved or cleared by the [place] FDA and  has been authorized for detection and/or diagnosis of SARS-CoV-2 by  FDA under an Emergency Use Authorization (EUA). This EUA will remain  in effect (meaning this test can be used) for the duration of the  Covid-19 declaration under Section 564(b)(1) of the Act, 21  U.S.C. section 360bbb-3(b)(1), unless the authorization is  terminated or revoked. Performed at [facility], [place] [address] [place], [place] [address]      Labs: Basic Metabolic Panel: Recent Labs  Lab 11/29/19 1933 11/30/19 0424 11/30/19 1906 12/01/19 0158 12/02/19 0843  NA 135 137  --  136 136  K 4.2 4.2  --   3.6 3.9  CL 101 104  --  100 105  CO2 24 23  --  21* 23  GLUCOSE 138* 142*  --  107* 123*  BUN 21 21  --  15 16  CREATININE 1.03* 0.85 1.07* 1.07* 0.92  CALCIUM 8.9 8.7*  --  8.6* 8.3*   Liver Function Tests: Recent Labs  Lab 11/29/19 1933  AST 17  ALT 14  ALKPHOS 48  BILITOT 0.5  PROT 6.2*  ALBUMIN 3.8   No results for input(s): LIPASE, AMYLASE in the last 168 hours. No results for input(s): AMMONIA in the last 168 hours. CBC: Recent Labs  Lab 11/29/19 1933 11/29/19 1933 11/30/19 0424 11/30/19 1906 12/01/19 0158 12/01/19 1539 12/02/19 0843  WBC 12.3*   < > 13.6* 16.4* 15.1* 14.0* 13.0*  NEUTROABS 9.8*  --   --   --   --  11.0*  --   HGB 13.2   < > 12.5 12.5 11.2* 11.5* 11.4*  HCT 40.5   < > 38.4 38.3 34.4* 35.5* 34.1*  MCV 98.3   < > 98.7 96.5 96.1 97.0 97.4  PLT 242   < > 224 240 228 213 209   < > = values in this interval not displayed.   Cardiac Enzymes: No results for input(s): CKTOTAL, CKMB, CKMBINDEX, TROPONINI in the last 168 hours. BNP: BNP (last 3 results) No results for input(s): BNP in the last 8760 hours.  ProBNP (last 3 results) No results for input(s): PROBNP in the last 8760 hours.  CBG: Recent Labs  Lab 11/30/19 1957 12/01/19 0851 12/01/19 1544 12/01/19 2352 12/02/19 0839  GLUCAP 191* 107* 96 126* 125*       Signed:  Kayleen Memos, MD [facility] 12/02/2019, 11:33 AM

## 2019-12-02 NOTE — TOC Transition Note (Signed)
Transition of Care [facility]) - CM/SW Discharge Note   Patient Details  Name: Kari Colon MRN: [id number] Date of Birth: 1935-07-31  [facility] CM/SW Contact:  Alexander Mt, LCSW Phone Number: 12/02/2019, 11:54 AM   Clinical Narrative:    CSW arranged [facility] Transport to [facility] for 1pm, d/c summary sent and controlled scripts requested from Dr. Nevada Crane. RN Otila Kluver aware and will call report.   Final next level of care: Skilled Nursing Facility Barriers to Discharge: Barriers Resolved   Patient Goals and CMS Choice Patient states their goals for this hospitalization and ongoing recovery are:: return to [facility] w/hospice CMS Medicare.gov Compare Post Acute Care list provided to:: Patient Represenative (must comment)(pt daughter; pt is LTC resident) Choice offered to / list presented to : Adult Children  Discharge Placement   Existing PASRR number confirmed : 12/01/19          Patient [facility] bed at: [facility] Patient to be transferred to facility by: [place] Name of family member notified: pt daughter Estill Bamberg Patient and family notified of of transfer: 12/02/19  Discharge Plan and Services In-house Referral: Clinical Social Work Discharge Planning Services: CM Consult Post Acute Care Choice: Hospice, Resumption of Svcs/PTA Provider, [place]             Readmission Risk Interventions Readmission Risk Prevention Plan 12/02/2019  Transportation Screening Complete  PCP or Specialist Appt within 5-7 Days Not Complete  Not Complete comments SNF resident  Home Care Screening Complete  Medication Review (RN CM) Referral to Pharmacy  Some recent data might be hidden

## 2019-12-02 NOTE — Social Work (Addendum)
Clinical Social Worker facilitated patient discharge including contacting patient family and facility to confirm patient discharge plans.  Clinical information faxed to facility and family agreeable with plan.  CSW arranged ambulance transport via [facility] ([facility] contracted pt) to [facility] RN to call [phone]  with report prior to discharge.  Clinical Social Worker will sign off for now as social work intervention is no longer needed. Please consult [place] again if new need arises.  Westley Hummer, MSW, LCSW Clinical Social Worker

## 2019-12-03 DIAGNOSIS — F419 Anxiety disorder, unspecified: Secondary | ICD-10-CM | POA: Diagnosis not present

## 2019-12-03 DIAGNOSIS — E119 Type 2 diabetes mellitus without complications: Secondary | ICD-10-CM | POA: Diagnosis not present

## 2019-12-03 DIAGNOSIS — I25119 Atherosclerotic heart disease of native coronary artery with unspecified angina pectoris: Secondary | ICD-10-CM | POA: Diagnosis not present

## 2019-12-07 DIAGNOSIS — S72002D Fracture of unspecified part of neck of left femur, subsequent encounter for closed fracture with routine healing: Secondary | ICD-10-CM | POA: Diagnosis not present

## 2019-12-07 DIAGNOSIS — N390 Urinary tract infection, site not specified: Secondary | ICD-10-CM | POA: Diagnosis not present

## 2019-12-07 DIAGNOSIS — I10 Essential (primary) hypertension: Secondary | ICD-10-CM | POA: Diagnosis not present

## 2019-12-07 DIAGNOSIS — E119 Type 2 diabetes mellitus without complications: Secondary | ICD-10-CM | POA: Diagnosis not present

## 2019-12-09 ENCOUNTER — Other Ambulatory Visit: Payer: Self-pay

## 2019-12-09 DIAGNOSIS — Z1231 Encounter for screening mammogram for malignant neoplasm of breast: Secondary | ICD-10-CM

## 2019-12-17 ENCOUNTER — Encounter: Payer: Self-pay | Admitting: Orthopaedic Surgery

## 2019-12-17 ENCOUNTER — Other Ambulatory Visit: Payer: Self-pay

## 2019-12-17 ENCOUNTER — Ambulatory Visit (INDEPENDENT_AMBULATORY_CARE_PROVIDER_SITE_OTHER): Payer: Medicare Other

## 2019-12-17 ENCOUNTER — Ambulatory Visit (INDEPENDENT_AMBULATORY_CARE_PROVIDER_SITE_OTHER): Payer: Medicare Other | Admitting: Orthopaedic Surgery

## 2019-12-17 DIAGNOSIS — M25552 Pain in left hip: Secondary | ICD-10-CM

## 2019-12-17 DIAGNOSIS — S72002A Fracture of unspecified part of neck of left femur, initial encounter for closed fracture: Secondary | ICD-10-CM

## 2019-12-17 NOTE — Progress Notes (Signed)
Post-Op Visit Note   Patient: Kari Colon           Date of Birth: [birth year]           MRN: [id number] Visit Date: 12/17/2019 [facility]   Assessment & Plan:  Chief Complaint:  Chief Complaint  Patient presents with  . Left Hip - Pain, Follow-up   Visit Diagnoses:  1. Pain in left hip   2. Closed displaced fracture of left femoral neck ([place])     Plan: Ms. Herry is 2 weeks status post left partial hip replacement.  She is mainly ambulating in a wheelchair.  No complaints.  Surgical incision is healed.  Sutures removed today.  She is stable from my standpoint.  Continue PT at her facility.  Recheck in 4 weeks with AP pelvis x-rays.  Follow-Up Instructions: Return in about 4 weeks (around 01/14/2020).   Orders:  Orders Placed This Encounter  Procedures  . XR HIP UNILAT W OR W/O PELVIS 2-3 VIEWS LEFT   No orders of the defined types were placed in this encounter.   Imaging: XR HIP UNILAT W OR W/O PELVIS 2-3 VIEWS LEFT  Result Date: 12/17/2019 Stable left partial hip replacement   PMFS History: Patient Active Problem List   Diagnosis Date Noted  . Closed displaced fracture of left femoral neck ([place]) 11/29/2019  . Hip fracture ([place]) 11/29/2019  . Syncope 12/29/2017  . Carotid bruit 12/29/2017  . Depression with anxiety 12/29/2017  . Factor V deficiency ([place]) 12/29/2017  . GERD (gastroesophageal reflux disease) 12/29/2017  . Heart murmur 12/29/2017   Past Medical History:  Diagnosis Date  . Aftercare for healing traumatic fracture of hip   . Carotid bruit   . Chronic rhinitis   . Depressive disorder, not elsewhere classified   . Diabetes mellitus without complication ([place])   . Diverticulitis   . Duodenal ulcer   . Factor V deficiency ([place])   . Fibrocystic breast disease   . Fibromuscular dysplasia ([place])   . Generalized pain   . GERD (gastroesophageal reflux disease)   . Heart murmur   . Hyperlipidemia   . 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 Insomnia, unspecified   . Macular degeneration   . Mesenteric ischemia ([place])   . Other abnormal glucose   . Other specified rehabilitation procedure(V57.89)   . Personal history of fall   . Pulmonary HTN ([place])   . Reflux esophagitis   . Renal artery stenosis ([place])   . Subclavian arterial stenosis ([place])   . Tricuspid regurgitation   . Tubular adenoma of colon   . Unspecified constipation   . Unspecified essential hypertension   . Vitamin D deficiency     Family History  Problem Relation Age of Onset  . Diabetes Mellitus II Maternal Grandfather     Past Surgical History:  Procedure Laterality Date  . ANTERIOR APPROACH HEMI HIP ARTHROPLASTY Left 11/30/2019   Procedure: ANTERIOR APPROACH HEMI HIP ARTHROPLASTY;  Surgeon: Leandrew Koyanagi, MD;  Location: [facility];  Service: Orthopedics;  Laterality: Left;  . AORTA - SUPERIOR MESENTERIC AND AORTA - RENAL ARTERY BYPASS GRAFT    . APPENDECTOMY    . CHOLECYSTECTOMY    . DILATION AND CURETTAGE OF UTERUS    . HIP SURGERY    . RENAL ARTERY BYPASS    . TONSILLECTOMY     Social History   Occupational History  . Not on file  Tobacco Use  . Smoking status:  Current Every Day Smoker    Packs/day: 1.00    Types: Cigarettes  . Smokeless tobacco: Former Network engineer and Sexual Activity  . Alcohol use: No  . Drug use: No  . Sexual activity: Not on file

## 2019-12-23 DIAGNOSIS — G8929 Other chronic pain: Secondary | ICD-10-CM | POA: Diagnosis not present

## 2019-12-23 DIAGNOSIS — I10 Essential (primary) hypertension: Secondary | ICD-10-CM | POA: Diagnosis not present

## 2019-12-23 DIAGNOSIS — K5900 Constipation, unspecified: Secondary | ICD-10-CM | POA: Diagnosis not present

## 2019-12-23 DIAGNOSIS — E119 Type 2 diabetes mellitus without complications: Secondary | ICD-10-CM | POA: Diagnosis not present

## 2019-12-28 DIAGNOSIS — R05 Cough: Secondary | ICD-10-CM | POA: Diagnosis not present

## 2019-12-31 DIAGNOSIS — J189 Pneumonia, unspecified organism: Secondary | ICD-10-CM | POA: Diagnosis not present

## 2019-12-31 DIAGNOSIS — S72002D Fracture of unspecified part of neck of left femur, subsequent encounter for closed fracture with routine healing: Secondary | ICD-10-CM | POA: Diagnosis not present

## 2019-12-31 DIAGNOSIS — E119 Type 2 diabetes mellitus without complications: Secondary | ICD-10-CM | POA: Diagnosis not present

## 2019-12-31 DIAGNOSIS — I10 Essential (primary) hypertension: Secondary | ICD-10-CM | POA: Diagnosis not present

## 2020-01-14 DIAGNOSIS — R451 Restlessness and agitation: Secondary | ICD-10-CM | POA: Diagnosis not present

## 2020-01-15 DIAGNOSIS — R319 Hematuria, unspecified: Secondary | ICD-10-CM | POA: Diagnosis not present

## 2020-01-15 DIAGNOSIS — N390 Urinary tract infection, site not specified: Secondary | ICD-10-CM | POA: Diagnosis not present

## 2020-01-16 IMAGING — MR MR THORACIC SPINE W/O CM
4 of 5 series · 25 of 48 positions shown · non-contrast
Comparison: None.

CLINICAL DATA: Thoracic radiculitis.  Lumbar radiculopathy.

EXAM:
MRI THORACIC AND LUMBAR SPINE WITHOUT CONTRAST
TECHNIQUE: Multiplanar and multiecho pulse sequences of the thoracic and lumbar
spine were obtained without intravenous contrast.

[Series 2: T2 · sagittal · 4.0mm · 0.73mm/px · 6 of 18 slices shown (1 of 2)]
[im 1/18]
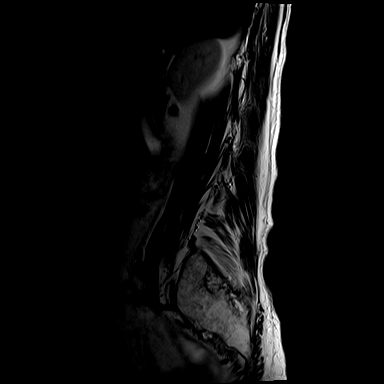
[im 4/18]
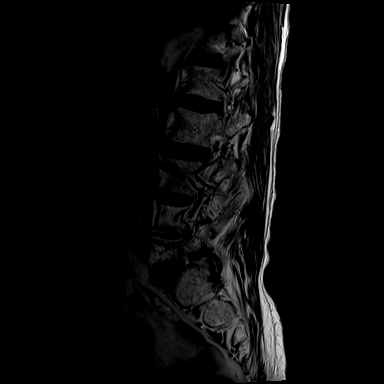
[im 7/18]
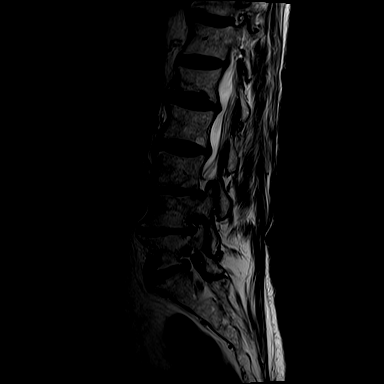
[im 11/18]
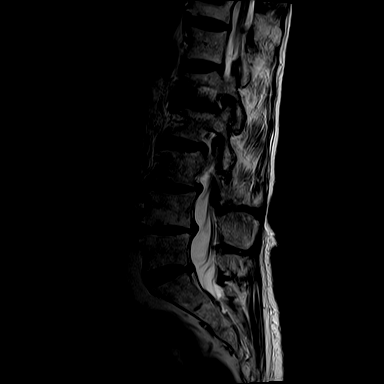
[im 14/18]
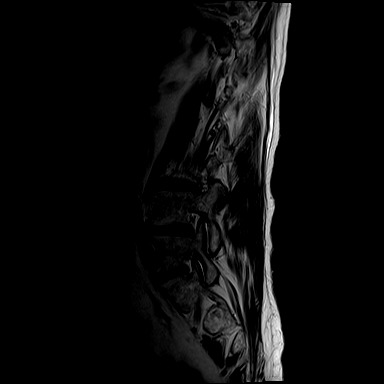
[im 18/18]
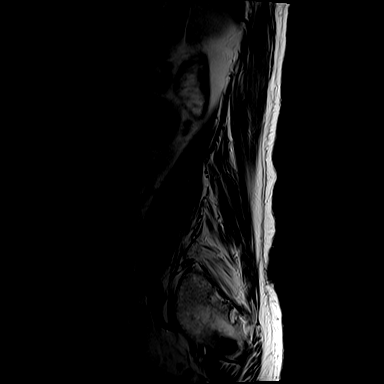

[Series 4: T1 · sagittal · 4.0mm · 1.09mm/px · 7 of 18 slices shown (1 of 2)]
[im 1/18]
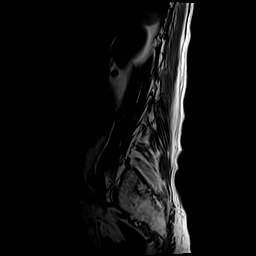
[im 3/18]
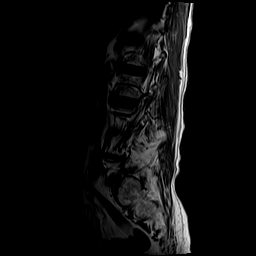
[im 6/18]
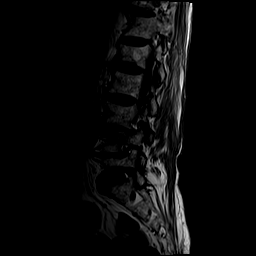
[im 9/18]
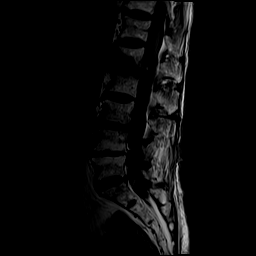
[im 12/18]
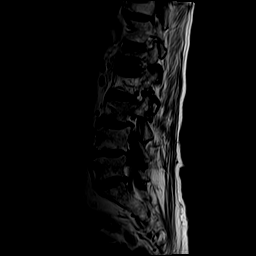
[im 15/18]
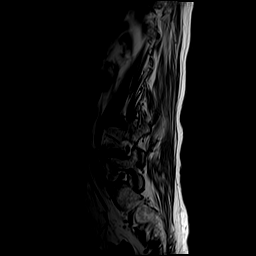
[im 18/18]
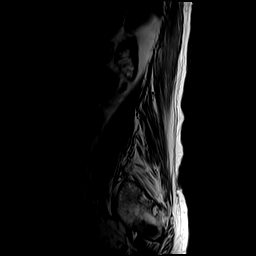

[Series 5: T2 · axial · 4.0mm · 0.39mm/px · z∈[-452,-266]mm · 8 of 35 slices shown (2 of 2)]
[im 1/35]
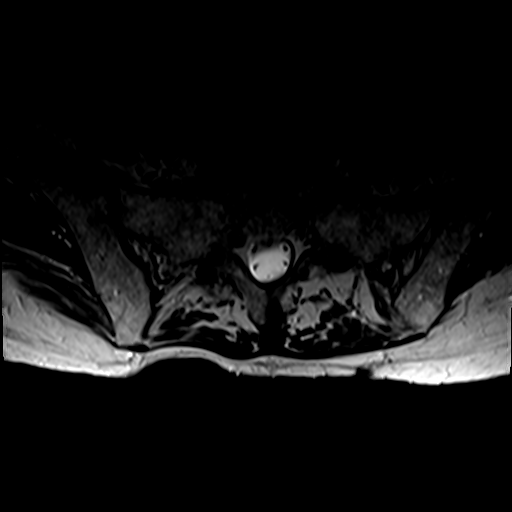
[im 6/35]
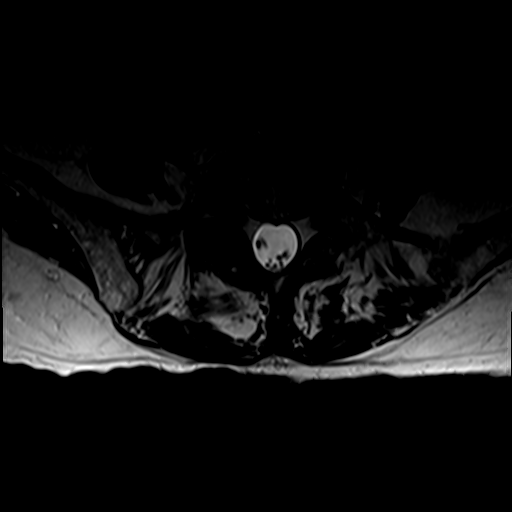
[im 11/35]
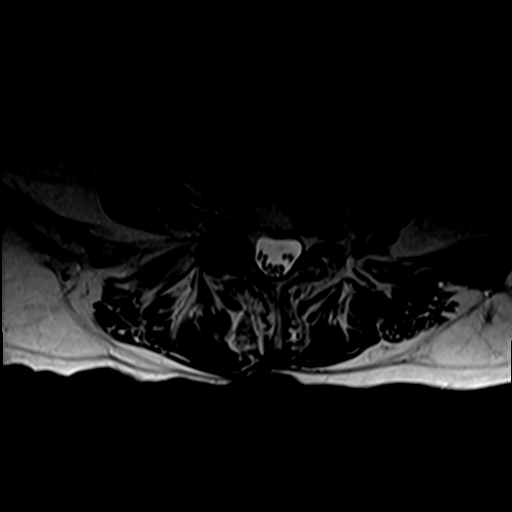
[im 16/35]
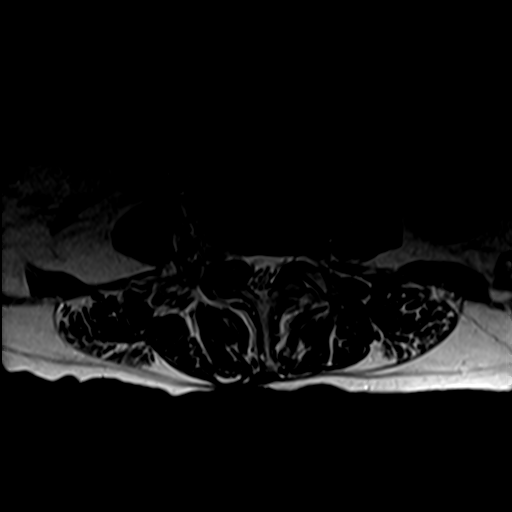
[im 19/35]
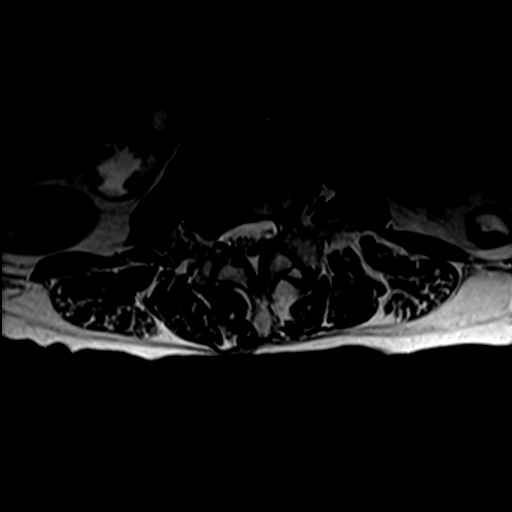
[im 24/35]
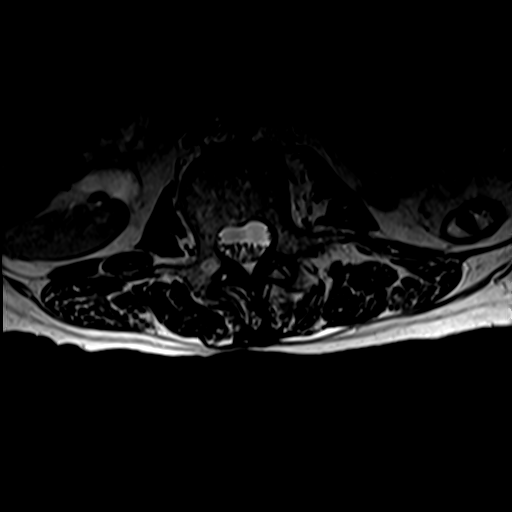
[im 29/35]
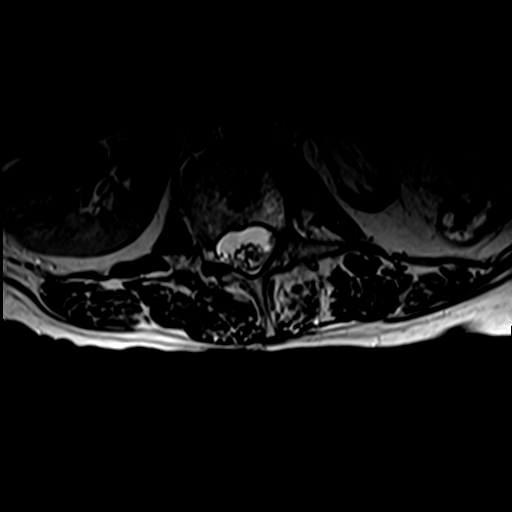
[im 35/35]
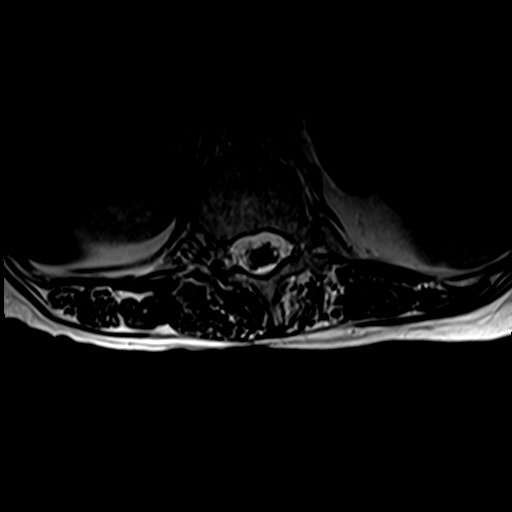

[Series 6: T1 · axial · 4.0mm · 0.39mm/px · z∈[-452,-294]mm · 4 of 35 slices shown (2 of 2)]
[im 1/35]
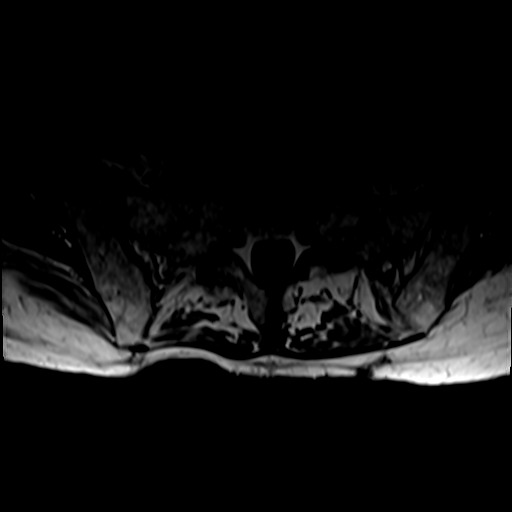
[im 6/35]
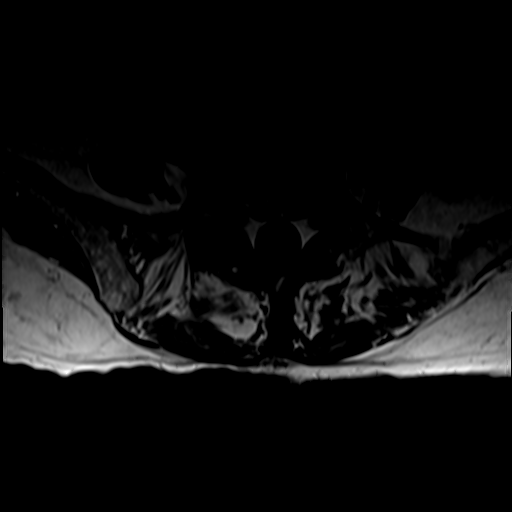
[im 19/35]
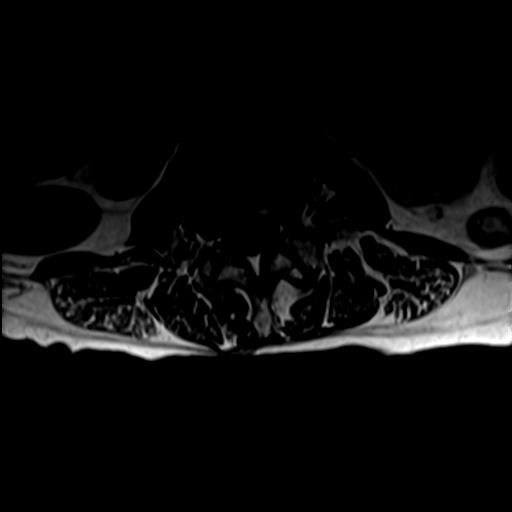
[im 29/35]
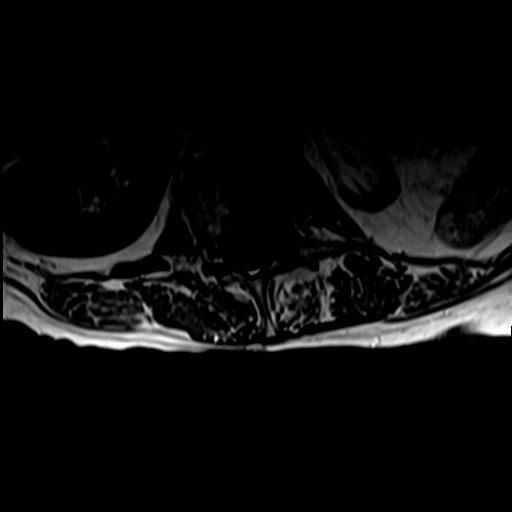

[25 of 48 positions shown; findings below may reference images not displayed]

FINDINGS: MRI THORACIC SPINE FINDINGS

Alignment: Anterolisthesis T2 relative to T4 due to chronic fracture
T3. Remaining alignment normal

Vertebrae: Severe chronic compression fracture of T3 which has
healed. Moderate compression fracture of T11 which has healed.
Compression fracture of L1. See below report.

Negative for acute fracture or mass

Cord: Thoracic cord syrinx extending from T6 through T9 measuring up
to 2 mm in diameter. No cord mass lesion identified. No cord
compression.

Paraspinal and other soft tissues: Paraspinous soft tissues normal.
No pleural effusion. No soft tissue mass

Disc levels:

Disc degeneration and spurring at T3-4 related to chronic fracture
of T3. No significant spinal stenosis. Disc degeneration and
spurring superior endplate of T11 related to chronic fracture of
T11. Mild cord flattening without significant spinal stenosis. Mild
disc degeneration and facet degeneration at T11-T12 and T12-L1
without significant spinal stenosis.

MRI LUMBAR SPINE FINDINGS

Segmentation:  Normal

Alignment:  Lumbar scoliosis.  Normal alignment.

Vertebrae: Chronic compression fracture of L1. Disc degeneration and
endplate spurring on the left at L1-2. No acute lumbar fracture or
mass.

Conus medullaris and cauda equina: Conus extends to the L1-2 level.
Conus and cauda equina appear normal.

Paraspinal and other soft tissues: Markedly atrophic left kidney.
Right kidney normal. No retroperitoneal mass.

Disc levels:

L1-2: Disc degeneration and spurring on the left with moderate left
foraminal encroachment. Mild spinal stenosis

L2-3: Disc degeneration with disc bulging and spurring. Mild facet
degeneration. Mild subarticular stenosis on the left

L3-4: Disc degeneration with disc bulging and spurring right greater
than left. Mild facet degeneration bilaterally. Moderate right
subarticular stenosis. Mild foraminal narrowing bilaterally

L4-5: Disc degeneration and spondylosis right greater than left.
Severe right subarticular and foraminal encroachment. Spinal canal
adequate in size

L5-S1: Disc degeneration and spurring on the right. Mild facet
hypertrophy. Moderate right subarticular and foraminal encroachment
due to spurring.
IMPRESSION: MR THORACIC SPINE IMPRESSION

Chronic compression fracture T3 and T11.  No acute fracture.

2 mm diameter thoracic cord syrinx T6 through T9 without cord
compression or cord lesion.

Mild thoracic degenerative changes without significant stenosis.

MR LUMBAR SPINE IMPRESSION

Lumbar scoliosis with multilevel degenerative change. Moderate
subarticular stenosis on the right at L3-4 with mild foraminal
narrowing bilaterally.

Severe right subarticular and foraminal stenosis L4-5 due to
spurring

Moderate subarticular and foraminal stenosis L5-S1 due to spurring.

## 2020-01-17 DIAGNOSIS — Z712 Person consulting for explanation of examination or test findings: Secondary | ICD-10-CM | POA: Diagnosis not present

## 2020-01-20 DIAGNOSIS — E119 Type 2 diabetes mellitus without complications: Secondary | ICD-10-CM | POA: Diagnosis not present

## 2020-01-20 DIAGNOSIS — I10 Essential (primary) hypertension: Secondary | ICD-10-CM | POA: Diagnosis not present

## 2020-01-20 DIAGNOSIS — S72002D Fracture of unspecified part of neck of left femur, subsequent encounter for closed fracture with routine healing: Secondary | ICD-10-CM | POA: Diagnosis not present

## 2020-01-20 DIAGNOSIS — F329 Major depressive disorder, single episode, unspecified: Secondary | ICD-10-CM | POA: Diagnosis not present

## 2020-02-04 DIAGNOSIS — E1159 Type 2 diabetes mellitus with other circulatory complications: Secondary | ICD-10-CM | POA: Diagnosis not present

## 2020-02-04 DIAGNOSIS — E119 Type 2 diabetes mellitus without complications: Secondary | ICD-10-CM | POA: Diagnosis not present

## 2020-02-04 DIAGNOSIS — R197 Diarrhea, unspecified: Secondary | ICD-10-CM | POA: Diagnosis not present

## 2020-02-04 DIAGNOSIS — S72002D Fracture of unspecified part of neck of left femur, subsequent encounter for closed fracture with routine healing: Secondary | ICD-10-CM | POA: Diagnosis not present

## 2020-02-17 DIAGNOSIS — I10 Essential (primary) hypertension: Secondary | ICD-10-CM | POA: Diagnosis not present

## 2020-02-17 DIAGNOSIS — I2510 Atherosclerotic heart disease of native coronary artery without angina pectoris: Secondary | ICD-10-CM | POA: Diagnosis not present

## 2020-02-17 DIAGNOSIS — F329 Major depressive disorder, single episode, unspecified: Secondary | ICD-10-CM | POA: Diagnosis not present

## 2020-02-17 DIAGNOSIS — E119 Type 2 diabetes mellitus without complications: Secondary | ICD-10-CM | POA: Diagnosis not present

## 2020-03-08 DIAGNOSIS — N390 Urinary tract infection, site not specified: Secondary | ICD-10-CM | POA: Diagnosis not present

## 2020-03-08 DIAGNOSIS — A0472 Enterocolitis due to Clostridium difficile, not specified as recurrent: Secondary | ICD-10-CM | POA: Diagnosis not present

## 2020-03-08 DIAGNOSIS — A0471 Enterocolitis due to Clostridium difficile, recurrent: Secondary | ICD-10-CM | POA: Diagnosis not present

## 2020-03-09 DIAGNOSIS — M2042 Other hammer toe(s) (acquired), left foot: Secondary | ICD-10-CM | POA: Diagnosis not present

## 2020-03-09 DIAGNOSIS — E1151 Type 2 diabetes mellitus with diabetic peripheral angiopathy without gangrene: Secondary | ICD-10-CM | POA: Diagnosis not present

## 2020-03-09 DIAGNOSIS — B351 Tinea unguium: Secondary | ICD-10-CM | POA: Diagnosis not present

## 2020-03-13 DIAGNOSIS — R195 Other fecal abnormalities: Secondary | ICD-10-CM | POA: Diagnosis not present

## 2020-03-14 DIAGNOSIS — A0471 Enterocolitis due to Clostridium difficile, recurrent: Secondary | ICD-10-CM | POA: Diagnosis not present

## 2020-03-14 DIAGNOSIS — A0472 Enterocolitis due to Clostridium difficile, not specified as recurrent: Secondary | ICD-10-CM | POA: Diagnosis not present

## 2020-03-16 DIAGNOSIS — I2510 Atherosclerotic heart disease of native coronary artery without angina pectoris: Secondary | ICD-10-CM | POA: Diagnosis not present

## 2020-03-16 DIAGNOSIS — G8929 Other chronic pain: Secondary | ICD-10-CM | POA: Diagnosis not present

## 2020-03-16 DIAGNOSIS — S72002D Fracture of unspecified part of neck of left femur, subsequent encounter for closed fracture with routine healing: Secondary | ICD-10-CM | POA: Diagnosis not present

## 2020-03-16 DIAGNOSIS — E119 Type 2 diabetes mellitus without complications: Secondary | ICD-10-CM | POA: Diagnosis not present

## 2020-03-17 DIAGNOSIS — S72002D Fracture of unspecified part of neck of left femur, subsequent encounter for closed fracture with routine healing: Secondary | ICD-10-CM | POA: Diagnosis not present

## 2020-03-17 DIAGNOSIS — I2510 Atherosclerotic heart disease of native coronary artery without angina pectoris: Secondary | ICD-10-CM | POA: Diagnosis not present

## 2020-03-17 DIAGNOSIS — E119 Type 2 diabetes mellitus without complications: Secondary | ICD-10-CM | POA: Diagnosis not present

## 2020-03-17 DIAGNOSIS — E46 Unspecified protein-calorie malnutrition: Secondary | ICD-10-CM | POA: Diagnosis not present

## 2020-04-06 DIAGNOSIS — E119 Type 2 diabetes mellitus without complications: Secondary | ICD-10-CM | POA: Diagnosis not present

## 2020-04-06 DIAGNOSIS — S90822A Blister (nonthermal), left foot, initial encounter: Secondary | ICD-10-CM | POA: Diagnosis not present

## 2020-04-06 DIAGNOSIS — I10 Essential (primary) hypertension: Secondary | ICD-10-CM | POA: Diagnosis not present

## 2020-04-06 DIAGNOSIS — I2510 Atherosclerotic heart disease of native coronary artery without angina pectoris: Secondary | ICD-10-CM | POA: Diagnosis not present

## 2020-04-13 DIAGNOSIS — E119 Type 2 diabetes mellitus without complications: Secondary | ICD-10-CM | POA: Diagnosis not present

## 2020-04-13 DIAGNOSIS — S72002D Fracture of unspecified part of neck of left femur, subsequent encounter for closed fracture with routine healing: Secondary | ICD-10-CM | POA: Diagnosis not present

## 2020-04-13 DIAGNOSIS — S90822A Blister (nonthermal), left foot, initial encounter: Secondary | ICD-10-CM | POA: Diagnosis not present

## 2020-04-13 DIAGNOSIS — I2510 Atherosclerotic heart disease of native coronary artery without angina pectoris: Secondary | ICD-10-CM | POA: Diagnosis not present

## 2020-04-14 DIAGNOSIS — E46 Unspecified protein-calorie malnutrition: Secondary | ICD-10-CM | POA: Diagnosis not present

## 2020-04-14 DIAGNOSIS — I2510 Atherosclerotic heart disease of native coronary artery without angina pectoris: Secondary | ICD-10-CM | POA: Diagnosis not present

## 2020-04-14 DIAGNOSIS — S90822A Blister (nonthermal), left foot, initial encounter: Secondary | ICD-10-CM | POA: Diagnosis not present

## 2020-04-14 DIAGNOSIS — F329 Major depressive disorder, single episode, unspecified: Secondary | ICD-10-CM | POA: Diagnosis not present

## 2020-05-09 DIAGNOSIS — I2510 Atherosclerotic heart disease of native coronary artery without angina pectoris: Secondary | ICD-10-CM | POA: Diagnosis not present

## 2020-05-09 DIAGNOSIS — I10 Essential (primary) hypertension: Secondary | ICD-10-CM | POA: Diagnosis not present

## 2020-05-09 DIAGNOSIS — S72002D Fracture of unspecified part of neck of left femur, subsequent encounter for closed fracture with routine healing: Secondary | ICD-10-CM | POA: Diagnosis not present

## 2020-05-09 DIAGNOSIS — E119 Type 2 diabetes mellitus without complications: Secondary | ICD-10-CM | POA: Diagnosis not present
